# Patient Record
Sex: Female | Born: 1995 | Race: White | Hispanic: No | Marital: Single | Smoking: Never smoker
Health system: Southern US, Community
[De-identification: ages and names within clinical notes are randomized; demographics above are authoritative.]

## PROBLEM LIST (undated history)

## (undated) DIAGNOSIS — R51 Headache: Secondary | ICD-10-CM

## (undated) DIAGNOSIS — R109 Unspecified abdominal pain: Secondary | ICD-10-CM

## (undated) HISTORY — DX: Unspecified abdominal pain: R10.9

## (undated) HISTORY — DX: Headache: R51

---
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
## 2017-02-06 ENCOUNTER — Telehealth: Payer: Self-pay | Admitting: Internal Medicine

## 2017-02-06 NOTE — Telephone Encounter (Signed)
.    Can you clarify?  Is she on day 9 of a new pack and still bleeding?  Message unclear. The birth control is is not a new medication for her ,.but  It may have been affected by the zoloft we started Journie on at her last visit.  Breakthrough bleeding does sometimes occur when a new medication is started

## 2017-02-06 NOTE — Telephone Encounter (Signed)
Please advise 

## 2017-02-06 NOTE — Telephone Encounter (Signed)
Mom called regarding pt being on the birth control she's not supposed to have her period for one month and now she's on day nine. Please advise? Levonorgestrel-Ethinyl Estradiol (AMETHIA,CAMRESE) 0.15-0.03 &0.01 MG tablet  Call mom @ [phone]. Thank you!

## 2017-02-07 NOTE — Telephone Encounter (Signed)
Do you think pt should schedule an appt or wait a little bit longer to see if it stops? Please advise.

## 2017-02-07 NOTE — Telephone Encounter (Signed)
I WOULD WAIT A ANOTHER WEEK OR TWO

## 2017-02-07 NOTE — Telephone Encounter (Signed)
Patient is currently on day 10 of her period and a month away to start a period. Patients mother received Dr. Melina Schools statement and understands. She questions if pt should schedule an appt or wait it out considering the new medication causes break through bleeding.  Please call mother Misty Stanley [phone]

## 2017-02-07 NOTE — Telephone Encounter (Signed)
LMTCB

## 2017-02-11 NOTE — Telephone Encounter (Signed)
Yes

## 2017-02-11 NOTE — Telephone Encounter (Signed)
Patient's mother requested a call to discuss pt having a period for 14 days . Please call mother Misty Stanley) at [phone]

## 2017-02-11 NOTE — Telephone Encounter (Signed)
Patient scheduled.

## 2017-02-11 NOTE — Telephone Encounter (Signed)
Can you please schedule this appt for pt per Dr. Darrick Huntsman. Pt's mother is aware of appt date and time. Tomorrow at 4:30pm. Thank you so much!!

## 2017-02-11 NOTE — Telephone Encounter (Signed)
Spoke with pt's mother and she stated that she is very concerned about her daughter. She stated that the pt had her period for 13 days and stopped for 2 and as now started back as of yesterday. Pt mother stated that she is afraid that the pt has PCOS because she is showing some of the symptoms like hair loss and pain. She is also concerned because the pt already is anemic. Mother would like for pt to be seen as soon as possible. Would it be ok to schedule pt for tomorrow at 4:30pm?

## 2017-02-12 ENCOUNTER — Ambulatory Visit (INDEPENDENT_AMBULATORY_CARE_PROVIDER_SITE_OTHER): Payer: BLUE CROSS/BLUE SHIELD | Admitting: Internal Medicine

## 2017-02-12 ENCOUNTER — Other Ambulatory Visit: Payer: Self-pay | Admitting: Internal Medicine

## 2017-02-12 ENCOUNTER — Encounter: Payer: Self-pay | Admitting: Internal Medicine

## 2017-02-12 DIAGNOSIS — N939 Abnormal uterine and vaginal bleeding, unspecified: Secondary | ICD-10-CM

## 2017-02-12 DIAGNOSIS — N920 Excessive and frequent menstruation with regular cycle: Secondary | ICD-10-CM | POA: Diagnosis not present

## 2017-02-12 LAB — POCT URINE PREGNANCY: Preg Test, Ur: NEGATIVE

## 2017-02-12 MED ORDER — NORETHIN ACE-ETH ESTRAD-FE 1-20 MG-MCG PO TABS: 1.0000 | ORAL_TABLET | Freq: Every day | ORAL | 11 refills | Status: DC

## 2017-02-12 NOTE — Progress Notes (Signed)
Pre visit review using our clinic review tool, if applicable. No additional management support is needed unless otherwise documented below in the visit note. 

## 2017-02-12 NOTE — Progress Notes (Signed)
Subjective:  Patient ID: Heidi Goodwin, female    DOB: 13-Nov-1995  Age: 21 y.o. MRN: [id number]  CC: Diagnoses of Menorrhagia with regular cycle and Abnormal uterine bleeding unrelated to menstrual cycle were pertinent to this visit.  HPI CARLIS BURNSWORTH presents for eval and management of intermenstrual bleeding on extended oral hormonal contraception  History:  Started taking Camrese in October , and for 4 -5 months she had only scheduled bleeding which occurred only at the end  Of the 90 day cycle.   Then she had an unscheduled bleed on March 25th during her family vacation to [place].    Patient states that she has been bleeding for 13 days ,  Stopped for one day and started bleeding again 3 days ago.  The bleeding has not been heavy , but not light enough to go without a pad.  Sometimes would taper off by the end od the day  But always bleeds overnight .  She denies missing any doses,  Is not sexually active, and has not started any other medications or taken antibiotics. She denies bleeding from other mucous membranes and denies excessive bruising  Mother is concerned about PCOS because patient noted excessive hair on her abdomen and requested hair removal     Outpatient Medications Prior to Visit  Medication Sig Dispense Refill  . ALPRAZolam (XANAX) 0.25 MG tablet Take 1 tablet (0.25 mg total) by mouth daily as needed for anxiety. 30 tablet 0  . cyanocobalamin (,VITAMIN B-12,) 1000 MCG/ML injection Inject 1 mL (1,000 mcg total) into the muscle once a week. Weekly for 3 weeks,  Then monthly 10 mL 0  . INS SYRINGE/NEEDLE 1CC/28G (B-D INSULIN SYRINGE 1CC/28G) 28G X 1/2" 1 ML MISC For use with B12 injections 10 each 1  . sertraline (ZOLOFT) 50 MG tablet Take 1 tablet (50 mg total) by mouth daily. 90 tablet 0  . TANDEM 162-115.2 MG CAPS capsule TAKE 1 CAPSULE BY MOUTH DAILY WITH BREAKFAST. 30 capsule 3  . Levonorgestrel-Ethinyl Estradiol (AMETHIA,CAMRESE) 0.15-0.03  &0.01 MG tablet Take 1 tablet by mouth daily. 1 Package 4   No facility-administered medications prior to visit.     Review of Systems;  Patient denies headache, fevers, malaise, unintentional weight loss, skin rash, eye pain, sinus congestion and sinus pain, sore throat, dysphagia,  hemoptysis , cough, dyspnea, wheezing, chest pain, palpitations, orthopnea, edema, abdominal pain, nausea, melena, diarrhea, constipation, flank pain, dysuria, hematuria, urinary  Frequency, nocturia, numbness, tingling, seizures,  Focal weakness, Loss of consciousness,  Tremor, insomnia, depression, anxiety, and suicidal ideation.      Objective:  BP 112/74   Pulse (!) 118   Temp 97.8 F (36.6 C) (Oral)   Resp 17   Ht  (1.651 m)   Wt 121 lb 9.6 oz (55.2 kg)   SpO2 99%   BMI 20.24 kg/m   BP Readings from Last 3 Encounters:  02/12/17 112/74  11/23/16 128/70  08/15/16 112/68    Wt Readings from Last 3 Encounters:  02/12/17 121 lb 9.6 oz (55.2 kg) (43 %, Z= -0.17)*  11/23/16 124 lb 6 oz (56.4 kg) (50 %, Z= 0.00)*  08/15/16 123 lb 4 oz (55.9 kg) (49 %, Z= -0.03)*   * Growth percentiles are based on CDC 2-20 Years data.    General appearance: alert, cooperative and appears stated age Ears: normal TM's and external ear canals both ears Throat: lips, mucosa, and tongue normal; teeth and gums normal Neck: no adenopathy, no  carotid bruit, supple, symmetrical, trachea midline and thyroid not enlarged, symmetric, no tenderness/mass/nodules Back: symmetric, no curvature. ROM normal. No CVA tenderness. Lungs: clear to auscultation bilaterally Heart: regular rate and rhythm, S1, S2 normal, no murmur, click, rub or gallop Abdomen: soft, non-tender; bowel sounds normal; no masses,  no organomegaly Pulses: 2+ and symmetric Skin: Skin color, texture, turgor normal. No rashes or lesions Lymph nodes: Cervical, supraclavicular, and axillary nodes normal.  No results found for: HGBA1C  Lab Results    Component Value Date   CREATININE 0.85 02/14/2017   CREATININE 0.78 11/23/2016   CREATININE 0.73 08/15/2016    Lab Results  Component Value Date   WBC 5.7 02/14/2017   HGB 15.3 02/14/2017   HCT 45.5 02/14/2017   PLT 252.0 02/14/2017   GLUCOSE 77 02/14/2017   ALT 9 02/14/2017   AST 11 02/14/2017   NA 140 02/14/2017   K 4.3 02/14/2017   CL 104 02/14/2017   CREATININE 0.85 02/14/2017   BUN 11 02/14/2017   CO2 28 02/14/2017   TSH 2.04 02/14/2017   INR 1.0 02/14/2017     Assessment & Plan:   Problem List Items Addressed This Visit    Abnormal uterine bleeding unrelated to menstrual cycle    Intermenstrual bleeding occurring ,  Has been on extended OCs (camrese).  Will workup for PCOS and change from levonorgestrel to less androgenic progestin with norethindrone /30 mcg EE  (Junel FE) .       Menorrhagia with regular cycle    Symptoms improved initially with use of oral contraceptive hormones but has now become unscheduled intermenstrual.          A total of 25 minutes of face to face time was spent with patient more than half of which was spent in counselling about the above mentioned conditions  and coordination of care  I have discontinued Ms. Saber's Levonorgestrel-Ethinyl Estradiol. I am also having her start on norethindrone-ethinyl estradiol. Additionally, I am having her maintain her cyanocobalamin, INS SYRINGE/NEEDLE 1CC/28G, sertraline, ALPRAZolam, and TANDEM.  Meds ordered this encounter  Medications  . norethindrone-ethinyl estradiol (JUNEL FE 1/20) 1-20 MG-MCG tablet    Sig: Take 1 tablet by mouth daily.    Dispense:  1 Package    Refill:  11    Medications Discontinued During This Encounter  Medication Reason  . Levonorgestrel-Ethinyl Estradiol (AMETHIA,CAMRESE) 0.15-0.03 &0.01 MG tablet     Follow-up: No Follow-up on file.   Sherlene Shams, MD

## 2017-02-14 ENCOUNTER — Other Ambulatory Visit (INDEPENDENT_AMBULATORY_CARE_PROVIDER_SITE_OTHER): Payer: BLUE CROSS/BLUE SHIELD

## 2017-02-14 DIAGNOSIS — N939 Abnormal uterine and vaginal bleeding, unspecified: Secondary | ICD-10-CM

## 2017-02-14 LAB — CBC WITH DIFFERENTIAL/PLATELET
Basophils Absolute: 0 10*3/uL (ref 0.0–0.1)
Basophils Relative: 0.4 % (ref 0.0–3.0)
EOS ABS: 0.1 10*3/uL (ref 0.0–0.7)
Eosinophils Relative: 1.6 % (ref 0.0–5.0)
HCT: 45.5 % (ref 36.0–49.0)
HEMOGLOBIN: 15.3 g/dL (ref 12.0–16.0)
LYMPHS ABS: 1.5 10*3/uL (ref 0.7–4.0)
Lymphocytes Relative: 26.9 % (ref 24.0–48.0)
MCHC: 33.7 g/dL (ref 31.0–37.0)
MCV: 94.2 fl (ref 78.0–98.0)
MONO ABS: 0.4 10*3/uL (ref 0.1–1.0)
Monocytes Relative: 7 % (ref 3.0–12.0)
NEUTROS PCT: 64.1 % (ref 43.0–71.0)
Neutro Abs: 3.7 10*3/uL (ref 1.4–7.7)
Platelets: 252 10*3/uL (ref 150.0–575.0)
RBC: 4.83 Mil/uL (ref 3.80–5.70)
RDW: 12.5 % (ref 11.4–15.5)
WBC: 5.7 10*3/uL (ref 4.5–13.5)

## 2017-02-14 LAB — COMPREHENSIVE METABOLIC PANEL
ALBUMIN: 4.6 g/dL (ref 3.5–5.2)
ALT: 9 U/L (ref 0–35)
AST: 11 U/L (ref 0–37)
Alkaline Phosphatase: 41 U/L — ABNORMAL LOW (ref 47–119)
BUN: 11 mg/dL (ref 6–23)
CHLORIDE: 104 meq/L (ref 96–112)
CO2: 28 meq/L (ref 19–32)
CREATININE: 0.85 mg/dL (ref 0.40–1.20)
Calcium: 9.7 mg/dL (ref 8.4–10.5)
GFR: 92.12 mL/min (ref >60.00)
Glucose, Bld: 77 mg/dL (ref 70–99)
Potassium: 4.3 mEq/L (ref 3.5–5.1)
SODIUM: 140 meq/L (ref 135–145)
Total Bilirubin: 0.5 mg/dL (ref 0.3–1.2)
Total Protein: 7.3 g/dL (ref 6.0–8.3)

## 2017-02-14 LAB — FERRITIN: FERRITIN: 14.2 ng/mL (ref 10.0–291.0)

## 2017-02-14 LAB — TSH: TSH: 2.04 u[IU]/mL (ref 0.40–5.00)

## 2017-02-14 LAB — IBC PANEL
IRON: 157 ug/dL — AB (ref 42–145)
SATURATION RATIOS: 30.6 % (ref 20.0–50.0)
Transferrin: 367 mg/dL — ABNORMAL HIGH (ref 212.0–360.0)

## 2017-02-14 LAB — PROTIME-INR
INR: 1 ratio (ref 0.8–1.0)
Prothrombin Time: 10.8 s (ref 9.6–13.1)

## 2017-02-14 NOTE — Assessment & Plan Note (Signed)
Symptoms improved initially with use of oral contraceptive hormones but has now become unscheduled intermenstrual.

## 2017-02-14 NOTE — Assessment & Plan Note (Addendum)
Intermenstrual bleeding occurring ,  Has been on extended OCs (camrese).  Will workup for PCOS and change from levonorgestrel to less androgenic progestin with norethindrone /30 mcg EE  (Junel FE) . Urein pregnancy test is negative

## 2017-02-15 LAB — PROLACTIN: PROLACTIN: 26.4 ng/mL

## 2017-02-17 LAB — TESTOS,TOTAL,FREE AND SHBG (FEMALE)
Sex Hormone Binding Glob.: 113 nmol/L (ref 17–124)
TESTOSTERONE,TOTAL,LC/MS/MS: 15 ng/dL (ref 2–45)
Testosterone, Free: 0.8 pg/mL (ref 0.1–6.4)

## 2017-02-18 ENCOUNTER — Encounter: Payer: Self-pay | Admitting: Internal Medicine

## 2017-02-18 ENCOUNTER — Telehealth: Payer: Self-pay | Admitting: Internal Medicine

## 2017-02-18 ENCOUNTER — Telehealth: Payer: Self-pay

## 2017-02-18 DIAGNOSIS — N939 Abnormal uterine and vaginal bleeding, unspecified: Secondary | ICD-10-CM

## 2017-02-18 NOTE — Telephone Encounter (Signed)
ordered

## 2017-02-18 NOTE — Assessment & Plan Note (Signed)
All labs normal including testosterone.  OCP changed to include less androgenic progesterone (norethindrone). If bleeding continues will prescribe 10 days of norethindrone 5 mg daily .  US pelvis pending

## 2017-02-20 ENCOUNTER — Encounter: Payer: Self-pay | Admitting: Internal Medicine

## 2017-02-20 ENCOUNTER — Ambulatory Visit
Admission: RE | Admit: 2017-02-20 | Discharge: 2017-02-20 | Disposition: A | Discharge status: Home / Self Care | Payer: BLUE CROSS/BLUE SHIELD | Source: Ambulatory Visit | Attending: Internal Medicine | Admitting: Internal Medicine

## 2017-02-20 DIAGNOSIS — N939 Abnormal uterine and vaginal bleeding, unspecified: Secondary | ICD-10-CM | POA: Insufficient documentation

## 2017-02-21 ENCOUNTER — Telehealth: Payer: Self-pay | Admitting: *Deleted

## 2017-02-21 ENCOUNTER — Other Ambulatory Visit: Payer: Self-pay | Admitting: Internal Medicine

## 2017-02-21 NOTE — Telephone Encounter (Signed)
mychart message was sent to patient already, mom is on DPR, attempted to reach her and left a VM to return my call, thanks

## 2017-02-21 NOTE — Telephone Encounter (Signed)
Patient's mother has requested to know if the ultrasound results are in  [place] [phone]

## 2017-02-22 ENCOUNTER — Other Ambulatory Visit: Payer: Self-pay | Admitting: Internal Medicine

## 2017-02-22 MED ORDER — SERTRALINE HCL 50 MG PO TABS: 50.0000 mg | ORAL_TABLET | Freq: Every day | ORAL | 1 refills | Status: DC

## 2017-02-22 NOTE — Telephone Encounter (Signed)
Refill sent.

## 2017-02-22 NOTE — Telephone Encounter (Signed)
Pharmacy called and is requesting a refill on pt's sertraline (ZOLOFT) 50 MG tablet. Pt does not have enough to get her through the weekend. Please advise, thank you!  Pharmacy - [facility] - [place], [place] - [address]

## 2017-02-25 NOTE — Telephone Encounter (Signed)
Patient's mother requested a call [phone]  Patient is having trouble signing into her mychart

## 2017-02-25 NOTE — Telephone Encounter (Signed)
LMTCB

## 2017-02-26 NOTE — Telephone Encounter (Signed)
It should improve with new hormones prescribed.  Since she is in her period week I do not want to add anything .  If after the first week of her new pack it has not stopped I will prescribe additional progesterone so let me know.

## 2017-02-26 NOTE — Telephone Encounter (Signed)
LVM for mother to call back

## 2017-02-26 NOTE — Telephone Encounter (Signed)
Spoke with pt's mom and informed her of the pt's [place] results because they have not been able to access her mychart account. The pt's mother stated that the pt is still bleeding pretty heavy, having some bad cramping but has not yet started the new hormone packet yet. Mother stated that the pt is now on the period week of her old pill pack. Please advise.

## 2017-02-28 NOTE — Telephone Encounter (Signed)
Spoke with pt's mother to inform her of the message below. The mother stated that yesterday the pt had to come home from school because her pad became so saturated that she bled through, she was passing large clots, and cramping really bad. The mother stated that she contacted a GYN and has gotten the pt an appt see one. Mother stated that she would let you know what they say as soon as she is seen.

## 2017-03-04 NOTE — Telephone Encounter (Signed)
Mailed unread message to patient.  

## 2017-05-17 ENCOUNTER — Telehealth: Payer: Self-pay | Admitting: Internal Medicine

## 2017-05-17 MED ORDER — FERROUS FUM-IRON POLYSACCH 162-115.2 MG PO CAPS: 1.0000 | ORAL_CAPSULE | Freq: Every day | ORAL | 1 refills | Status: DC

## 2017-05-17 NOTE — Telephone Encounter (Signed)
Refill sent as requested. 

## 2017-05-17 NOTE — Telephone Encounter (Signed)
Pt now needs the refills on Blisovi FE tab  sent to [facility] on [address] [place] and it needs to be a 90 day supply in order for insurance to cover.

## 2017-06-10 ENCOUNTER — Ambulatory Visit: Payer: BLUE CROSS/BLUE SHIELD | Admitting: Internal Medicine

## 2017-07-31 NOTE — Telephone Encounter (Signed)
Error

## 2017-08-14 ENCOUNTER — Other Ambulatory Visit: Payer: Self-pay

## 2017-08-14 MED ORDER — SERTRALINE HCL 50 MG PO TABS: 50.0000 mg | ORAL_TABLET | Freq: Every day | ORAL | 1 refills | Status: DC

## 2017-08-15 ENCOUNTER — Ambulatory Visit (INDEPENDENT_AMBULATORY_CARE_PROVIDER_SITE_OTHER): Payer: BLUE CROSS/BLUE SHIELD

## 2017-08-15 DIAGNOSIS — Z23 Encounter for immunization: Secondary | ICD-10-CM

## 2018-01-28 ENCOUNTER — Telehealth: Payer: Self-pay | Admitting: Internal Medicine

## 2018-01-28 MED ORDER — NORETHIN ACE-ETH ESTRAD-FE 1-20 MG-MCG PO TABS: 1.0000 | ORAL_TABLET | Freq: Every day | ORAL | 1 refills | Status: DC

## 2018-01-28 NOTE — Telephone Encounter (Signed)
Copied from CRM [phone]#[id number]. Topic: Quick Communication - Rx Refill/Question >> Jan 28, 2018 12:20 PM Zuzu Befort, Tresa EndoKelly, [place] wrote: Medication: Junel FE 1-20 mg-mcg Has the patient contacted their pharmacy? Yes Patients mother calling on behalf of her daughter because she needs a refill on the above medication. Stated that they have been in contact with the pharmacy as well Preferred Pharm[facility] (with phone number or street name[facility] [place] [address] [phone] Agent: Please be advised that RX refills may take up to 3 business days. We ask that you follow-up with your pharmacy.

## 2018-02-24 ENCOUNTER — Telehealth: Payer: Self-pay | Admitting: Internal Medicine

## 2018-02-24 NOTE — Telephone Encounter (Signed)
Copied from CRM [phone]. Topic: Quick Communication - Rx Refill/Question >> Feb 24, 2018  3:40 PM Percival SpanishKennedy, Cheryl W wrote: Medication  sertraline (ZOLOFT) 50 MG tablet  Has the patient contacted their pharmacy yes  has no more refills   [facility] [facility] [address]      Agent: Please be advised that RX refills may take up to 3 business days. We ask that you follow-up with your pharmacy.

## 2018-02-25 ENCOUNTER — Other Ambulatory Visit: Payer: Self-pay | Admitting: Internal Medicine

## 2018-02-25 ENCOUNTER — Other Ambulatory Visit: Payer: Self-pay

## 2018-02-25 MED ORDER — SERTRALINE HCL 50 MG PO TABS: 50.0000 mg | ORAL_TABLET | Freq: Every day | ORAL | 0 refills | Status: DC

## 2018-03-20 ENCOUNTER — Ambulatory Visit: Payer: BLUE CROSS/BLUE SHIELD | Admitting: Internal Medicine

## 2018-03-20 ENCOUNTER — Encounter: Payer: Self-pay | Admitting: Internal Medicine

## 2018-03-20 VITALS — BP 102/64 | HR 68 | Temp 97.6°F | Resp 15 | Ht 65.04 in | Wt 113.4 lb

## 2018-03-20 DIAGNOSIS — E538 Deficiency of other specified B group vitamins: Secondary | ICD-10-CM | POA: Diagnosis not present

## 2018-03-20 DIAGNOSIS — N920 Excessive and frequent menstruation with regular cycle: Secondary | ICD-10-CM

## 2018-03-20 DIAGNOSIS — R634 Abnormal weight loss: Secondary | ICD-10-CM | POA: Diagnosis not present

## 2018-03-20 DIAGNOSIS — Z0000 Encounter for general adult medical examination without abnormal findings: Secondary | ICD-10-CM

## 2018-03-20 DIAGNOSIS — Z79899 Other long term (current) drug therapy: Secondary | ICD-10-CM | POA: Diagnosis not present

## 2018-03-20 DIAGNOSIS — Z13220 Encounter for screening for lipoid disorders: Secondary | ICD-10-CM | POA: Diagnosis not present

## 2018-03-20 DIAGNOSIS — D509 Iron deficiency anemia, unspecified: Secondary | ICD-10-CM | POA: Diagnosis not present

## 2018-03-20 LAB — COMPREHENSIVE METABOLIC PANEL
ALT: 8 U/L (ref 0–35)
AST: 11 U/L (ref 0–37)
Albumin: 4.4 g/dL (ref 3.5–5.2)
Alkaline Phosphatase: 44 U/L — ABNORMAL LOW (ref 47–119)
BUN: 10 mg/dL (ref 6–23)
CO2: 26 meq/L (ref 19–32)
Calcium: 9.7 mg/dL (ref 8.4–10.5)
Chloride: 103 mEq/L (ref 96–112)
Creatinine, Ser: 0.79 mg/dL (ref 0.40–1.20)
GFR: 99.07 mL/min (ref >60.00)
GLUCOSE: 82 mg/dL (ref 70–99)
POTASSIUM: 4.3 meq/L (ref 3.5–5.1)
SODIUM: 139 meq/L (ref 135–145)
Total Bilirubin: 0.5 mg/dL (ref 0.2–1.2)
Total Protein: 7.5 g/dL (ref 6.0–8.3)

## 2018-03-20 LAB — CBC WITH DIFFERENTIAL/PLATELET
BASOS ABS: 0 10*3/uL (ref 0.0–0.1)
Basophils Relative: 0.5 % (ref 0.0–3.0)
EOS PCT: 1.1 % (ref 0.0–5.0)
Eosinophils Absolute: 0.1 10*3/uL (ref 0.0–0.7)
HEMATOCRIT: 42.8 % (ref 36.0–49.0)
Hemoglobin: 14.5 g/dL (ref 12.0–16.0)
LYMPHS PCT: 29.5 % (ref 24.0–48.0)
Lymphs Abs: 1.6 10*3/uL (ref 0.7–4.0)
MCHC: 33.8 g/dL (ref 31.0–37.0)
MCV: 92.4 fl (ref 78.0–98.0)
MONOS PCT: 7.8 % (ref 3.0–12.0)
Monocytes Absolute: 0.4 10*3/uL (ref 0.1–1.0)
NEUTROS ABS: 3.3 10*3/uL (ref 1.4–7.7)
Neutrophils Relative %: 61.1 % (ref 43.0–71.0)
PLATELETS: 259 10*3/uL (ref 150.0–575.0)
RBC: 4.63 Mil/uL (ref 3.80–5.70)
RDW: 13.1 % (ref 11.4–15.5)
WBC: 5.3 10*3/uL (ref 4.5–13.5)

## 2018-03-20 LAB — LIPID PANEL
Cholesterol: 205 mg/dL — ABNORMAL HIGH (ref 0–200)
HDL: 59.7 mg/dL (ref >39.00)
LDL CALC: 119 mg/dL — AB (ref 0–99)
NonHDL: 144.88
Total CHOL/HDL Ratio: 3
Triglycerides: 127 mg/dL (ref 0.0–149.0)
VLDL: 25.4 mg/dL (ref 0.0–40.0)

## 2018-03-20 LAB — VITAMIN B12: Vitamin B-12: 222 pg/mL (ref 211–911)

## 2018-03-20 LAB — TSH: TSH: 1.57 u[IU]/mL (ref 0.40–5.00)

## 2018-03-20 NOTE — Progress Notes (Signed)
Patient ID: Heidi Goodwin, female    DOB: 03/29/1996  Age: 21 y.o. MRN: [id number]  The patient is here for annual preventive examination and management of other chronic and acute problems.   The risk factors are reflected in the social history.  The roster of all physicians providing medical care to patient - is listed in the Snapshot section of the chart.  Activities of daily living:  The patient is 100% independent in all ADLs: dressing, toileting, feeding as well as independent mobility  Home safety : The patient has smoke detectors in the home. They wear seatbelts.  There are no firearms at home. There is no violence in the home.   There is no risks for hepatitis, STDs or HIV. There is no   history of blood transfusion. They have no travel history to infectious disease endemic areas of the world.  The patient has seen their dentist in the last six month. They have seen their eye doctor in the last year.    Discussed the need for sun protection: hats, long sleeves and use of sunscreen if there is significant sun exposure.   Diet: the importance of a healthy diet is discussed. They do have a healthy diet.  The benefits of regular aerobic exercise were discussed. She walks 4 times per week ,  20 minutes.   Depression screen: there are no signs or vegative symptoms of depression- irritability, change in appetite, anhedonia, sadness/tearfullness.  The following portions of the patient's history were reviewed and updated as appropriate: allergies, current medications, past family history, past medical history,  past surgical history, past social history  and problem list.  Visual acuity was not assessed per patient preference since she has regular follow up with her ophthalmologist. Hearing and body mass index were assessed and reviewed.   During the course of the visit the patient was educated and counseled about appropriate screening and preventive services including : fall  prevention , diabetes screening, nutrition counseling, colorectal cancer screening, and recommended immunizations.    CC: The primary encounter diagnosis was Long-term use of high-risk medication. Diagnoses of Screening for hyperlipidemia, Iron deficiency anemia, unspecified iron deficiency anemia type, B12 deficiency, Weight loss, Encounter for preventive health examination, and Menorrhagia with regular cycle were also pertinent to this visit. Finished freshman year at [employer].  Happy there,  Moving into apartment next year  Lost weight be "the dfood was not good." doing well in classes    History Heidi Goodwin has a past medical history of Abdominal discomfort (2016) and Frequent headaches (2015).   She has a past surgical history that includes Tonsilectomy/adenoidectomy with myringotomy (Bilateral, 2009).   Her family history includes Cancer (age of onset: 46) in her paternal grandfather.She reports that she has never smoked. She has never used smokeless tobacco. She reports that she does not drink alcohol or use drugs.  Outpatient Medications Prior to Visit  Medication Sig Dispense Refill  . ALPRAZolam (XANAX) 0.25 MG tablet Take 1 tablet (0.25 mg total) by mouth daily as needed for anxiety. 30 tablet 0  . cyanocobalamin (,VITAMIN B-12,) 1000 MCG/ML injection Inject 1 mL (1,000 mcg total) into the muscle once a week. Weekly for 3 weeks,  Then monthly 10 mL 0  . ferrous fumarate-iron polysaccharide complex (TANDEM) 162-115.2 MG CAPS capsule Take 1 capsule by mouth daily with breakfast. 90 capsule 1  . INS SYRINGE/NEEDLE 1CC/28G (B-D INSULIN SYRINGE 1CC/28G) 28G X 1/2" 1 ML MISC For use with B12 injections 10 each  1  . norethindrone-ethinyl estradiol (JUNEL FE 1/20) 1-20 MG-MCG tablet Take 1 tablet by mouth daily. MUST KEEP APPT ON 03/20/18 FOR REFILLS 28 tablet 0  . sertraline (ZOLOFT) 50 MG tablet Take 1 tablet (50 mg total) by mouth daily. 30 tablet 0  . ferrous fumarate-iron polysaccharide  complex (TANDEM) 162-115.2 MG CAPS capsule Take 1 capsule by mouth daily with breakfast. (Patient not taking: Reported on 03/20/2018) 90 capsule 1   No facility-administered medications prior to visit.     Review of Systems   Patient denies headache, fevers, malaise, unintentional weight loss, skin rash, eye pain, sinus congestion and sinus pain, sore throat, dysphagia,  hemoptysis , cough, dyspnea, wheezing, chest pain, palpitations, orthopnea, edema, abdominal pain, nausea, melena, diarrhea, constipation, flank pain, dysuria, hematuria, urinary  Frequency, nocturia, numbness, tingling, seizures,  Focal weakness, Loss of consciousness,  Tremor, insomnia, depression, anxiety, and suicidal ideation.     Objective:  BP 102/64 (BP Location: Left Arm, Patient Position: Sitting, Cuff Size: Normal)   Pulse 68   Temp 97.6 F (36.4 C) (Oral)   Resp 15   Ht 5' 5.04" (1.652 m)   Wt 113 lb 6.4 oz (51.4 kg)   SpO2 99%   BMI 18.85 kg/m   Physical Exam  General appearance: alert, cooperative and appears stated age Ears: normal TM's and external ear canals both ears Throat: lips, mucosa, and tongue normal; teeth and gums normal Neck: no adenopathy, no carotid bruit, supple, symmetrical, trachea midline and thyroid not enlarged, symmetric, no tenderness/mass/nodules Back: symmetric, no curvature. ROM normal. No CVA tenderness. Lungs: clear to auscultation bilaterally Heart: regular rate and rhythm, S1, S2 normal, no murmur, click, rub or gallop Abdomen: soft, non-tender; bowel sounds normal; no masses,  no organomegaly Pulses: 2+ and symmetric Skin: Skin color, texture, turgor normal. No rashes or lesions Lymph nodes: Cervical, supraclavicular, and axillary nodes normal.    Assessment & Plan:   Problem List Items Addressed This Visit    Iron deficiency anemia   Relevant Orders   Iron, TIBC and Ferritin Panel (Completed)   CBC with Differential/Platelet (Completed)   Menorrhagia with  regular cycle    improved with change in oral contraception       Encounter for preventive health examination    Annual  comprehensive physical examdone  .  During the course of the visit the patient was educated and counseled about appropriate screening and preventive services and screenings were discussed with regard to timing of initiation of cervical and breast cancer .  She is not sexually active and has had the Gardasil vaccine series as an adolescent.  She has a normal lipid profile and does not require annual lipid screening.   nutrition counseling, skin cancer screening has been done, along with review of the age appropriate recommended immunizations.  Printed recommendations for health maintenance screenings was given.       B12 deficiency   Relevant Orders   Vitamin B12 (Completed)    Other Visit Diagnoses    Long-term use of high-risk medication    -  Primary   Relevant Orders   Comprehensive metabolic panel (Completed)   Screening for hyperlipidemia       Relevant Orders   Lipid panel (Completed)   Weight loss       Relevant Orders   TSH (Completed)      I am having Heidi Goodwin "Heidi Goodwin" maintain her cyanocobalamin, INS SYRINGE/NEEDLE 1CC/28G, ALPRAZolam, ferrous fumarate-iron polysaccharide complex, sertraline, and norethindrone-ethinyl estradiol.  No orders of the defined types were placed in this encounter.   Medications Discontinued During This Encounter  Medication Reason  . ferrous fumarate-iron polysaccharide complex (TANDEM) 162-115.2 MG CAPS capsule Duplicate    Follow-up: No follow-ups on file.   Sherlene Shams, MD

## 2018-03-20 NOTE — Patient Instructions (Signed)

## 2018-03-21 LAB — IRON,TIBC AND FERRITIN PANEL
%SAT: 32 % (ref 8–45)
Ferritin: 15 ng/mL (ref 6–67)
Iron: 146 ug/dL (ref 27–164)
TIBC: 461 mcg/dL (calc) — ABNORMAL HIGH (ref 271–448)

## 2018-03-22 DIAGNOSIS — Z0000 Encounter for general adult medical examination without abnormal findings: Secondary | ICD-10-CM | POA: Insufficient documentation

## 2018-03-22 NOTE — Assessment & Plan Note (Addendum)
Annual  comprehensive physical examdone  .  During the course of the visit the patient was educated and counseled about appropriate screening and preventive services and screenings were discussed with regard to timing of initiation of cervical and breast cancer .  She is not sexually active and has had the Gardasil vaccine series as an adolescent.  She has a normal lipid profile and does not require annual lipid screening.   nutrition counseling, skin cancer screening has been done, along with review of the age appropriate recommended immunizations.  Printed recommendations for health maintenance screenings was given.

## 2018-03-22 NOTE — Assessment & Plan Note (Signed)
improved with change in oral contraception

## 2018-03-25 ENCOUNTER — Other Ambulatory Visit: Payer: Self-pay | Admitting: Internal Medicine

## 2018-03-26 NOTE — Telephone Encounter (Signed)
Pt had an appt 5/16 to refill this med and it is not at the pharmacy. Pt requesting this refill sertraline (ZOLOFT) 50 MG tablet  [id number] - [place], [place] - [address] AT [address] [phone] (Phone) [phone] (Fax)

## 2018-04-07 ENCOUNTER — Telehealth: Payer: Self-pay | Admitting: Internal Medicine

## 2018-04-07 NOTE — Telephone Encounter (Unsigned)
Copied from CRM [id number]. Topic: Quick Communication - Rx Refill/Question >> Apr 07, 2018  1:15 PM Heidi SarnaHayes, Heidi G wrote: norethindrone-ethinyl estradiol (JUNEL FE 1/20) 1-20 MG-MCG tablet-   Insurance will pay only if the Rx is a 90 day supply. Please send in Rx for 90 day supply.  [phone] - [place], [place] - [address] [facility] [address] [place] [id number] Phone: [phone] Fax: [phone]

## 2018-04-09 ENCOUNTER — Other Ambulatory Visit: Payer: Self-pay

## 2018-04-09 MED ORDER — NORETHIN ACE-ETH ESTRAD-FE 1-20 MG-MCG PO TABS: 1.0000 | ORAL_TABLET | Freq: Every day | ORAL | 0 refills | Status: DC

## 2018-04-09 NOTE — Telephone Encounter (Signed)
[facility] [address] [place]

## 2018-04-10 ENCOUNTER — Ambulatory Visit: Payer: BLUE CROSS/BLUE SHIELD

## 2018-04-17 ENCOUNTER — Ambulatory Visit (INDEPENDENT_AMBULATORY_CARE_PROVIDER_SITE_OTHER): Payer: BLUE CROSS/BLUE SHIELD | Admitting: *Deleted

## 2018-04-17 DIAGNOSIS — E538 Deficiency of other specified B group vitamins: Secondary | ICD-10-CM | POA: Diagnosis not present

## 2018-04-17 MED ORDER — CYANOCOBALAMIN 1000 MCG/ML IJ SOLN
1000.0000 ug | Freq: Once | INTRAMUSCULAR | Status: AC
  Administered 2018-04-17: 1000 ug via INTRAMUSCULAR

## 2018-04-17 NOTE — Progress Notes (Signed)
Patient presented for B 12 injection to left deltoid, patient voiced no concerns nor showed any signs of distress during injection See lab note for B 12 order.

## 2018-04-24 ENCOUNTER — Ambulatory Visit (INDEPENDENT_AMBULATORY_CARE_PROVIDER_SITE_OTHER): Payer: BLUE CROSS/BLUE SHIELD

## 2018-04-24 DIAGNOSIS — E538 Deficiency of other specified B group vitamins: Secondary | ICD-10-CM

## 2018-04-24 MED ORDER — CYANOCOBALAMIN 1000 MCG/ML IJ SOLN
1000.0000 ug | Freq: Once | INTRAMUSCULAR | Status: AC
  Administered 2018-04-24: 1000 ug via INTRAMUSCULAR

## 2018-04-24 NOTE — Progress Notes (Signed)
Patient comes in for a vitamin B 12 injection. Administered in right deltoid IM. Patient tolerated well.

## 2018-08-17 ENCOUNTER — Other Ambulatory Visit: Payer: Self-pay | Admitting: Internal Medicine

## 2018-09-22 ENCOUNTER — Other Ambulatory Visit: Payer: Self-pay | Admitting: Internal Medicine

## 2018-12-11 ENCOUNTER — Other Ambulatory Visit: Payer: Self-pay | Admitting: Internal Medicine

## 2019-01-14 IMAGING — US US PELVIS COMPLETE
1 series · 14 of 25 positions shown · non-contrast
Comparison: None.

CLINICAL DATA: Intermittent vaginal bleeding starting 01/27/2017,
virginal patient on OCP

EXAM:
TRANSABDOMINAL ULTRASOUND OF PELVIS
TECHNIQUE: Transabdominal ultrasound examination of the pelvis was performed
including evaluation of the uterus, ovaries, adnexal regions, and
pelvic cul-de-sac.

[Series 1: us pelvis complete · 0.24mm/px · 14 of 47 slices shown]
[im 1/47]
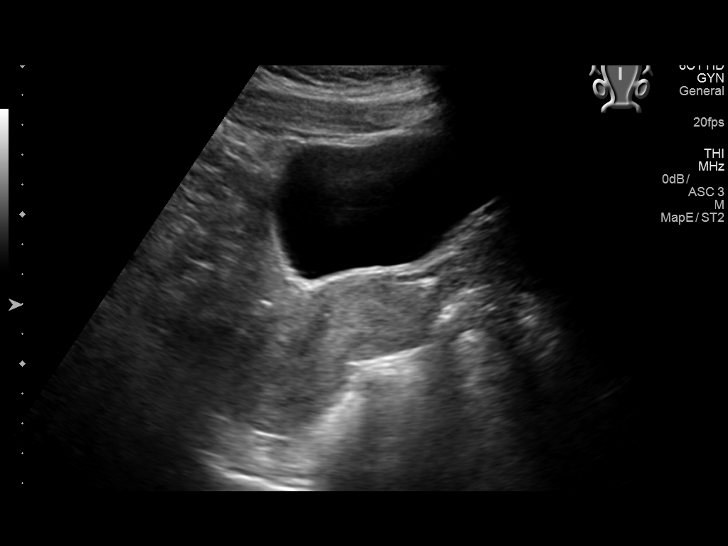
[im 4/47]
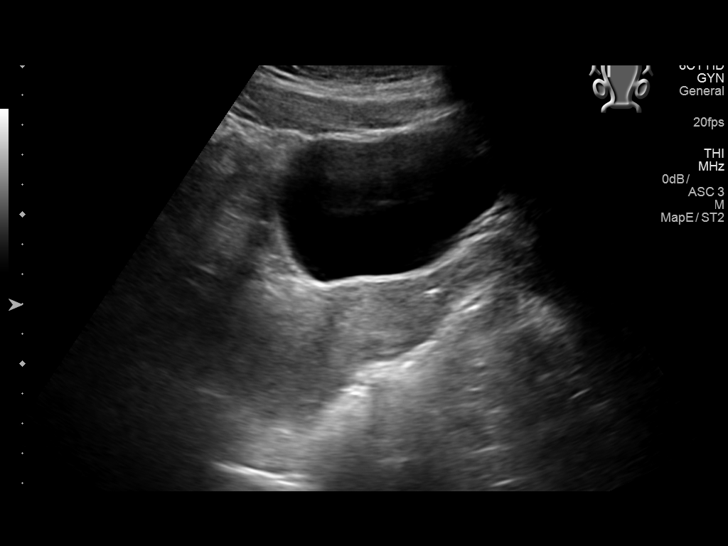
[im 8/47]
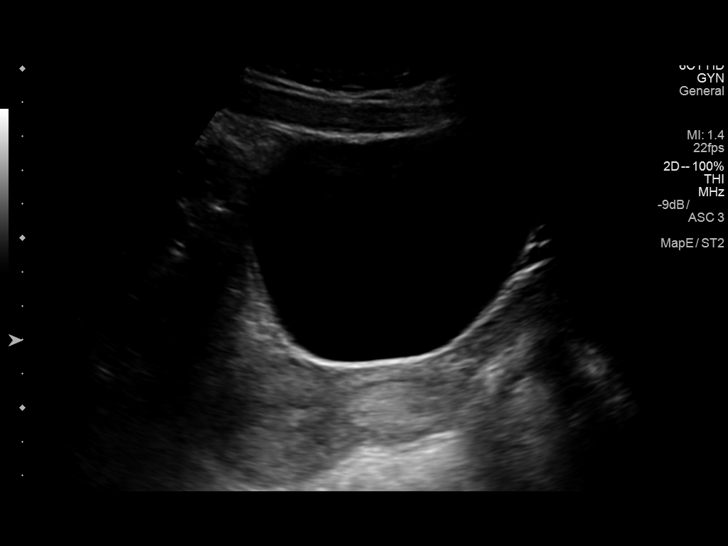
[im 12/47]
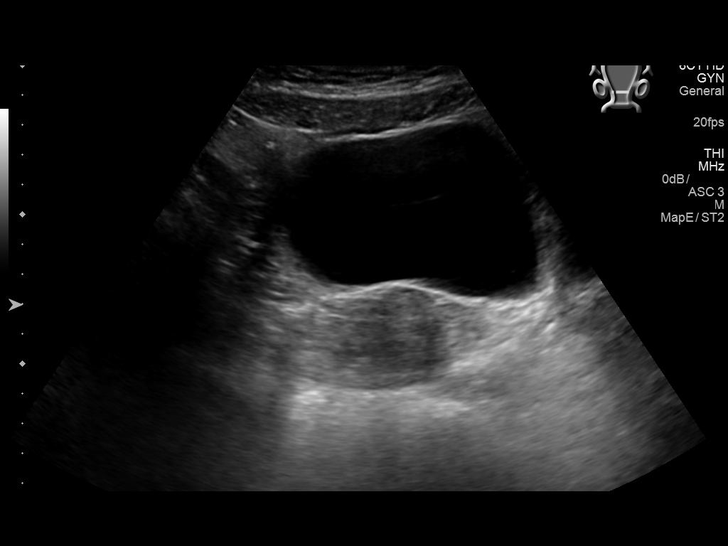
[im 16/47]
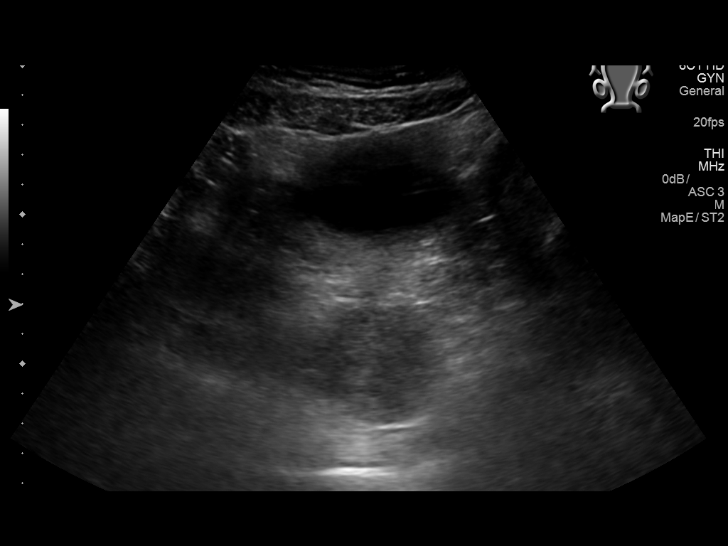
[im 18/47]
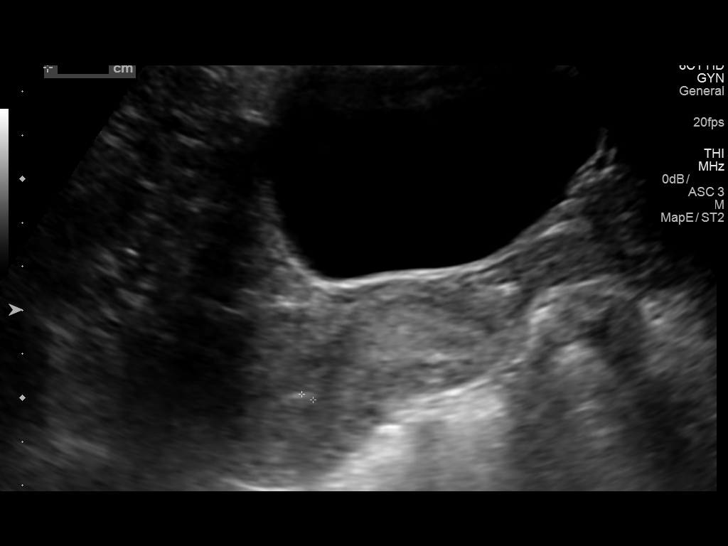
[im 22/47]
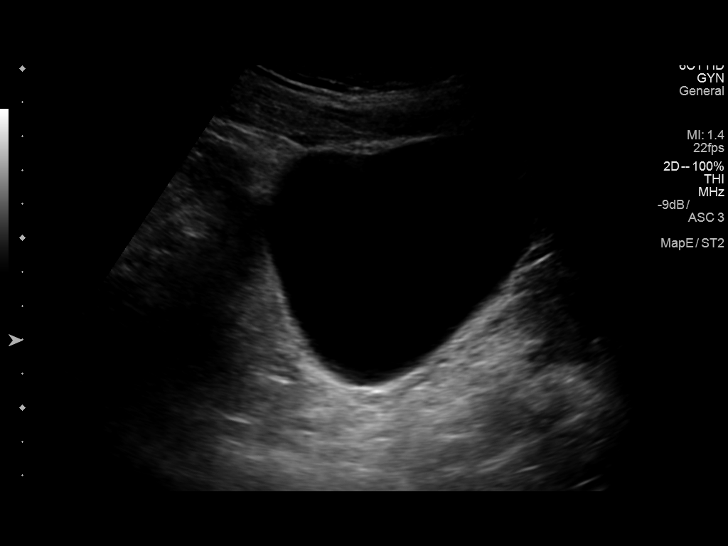
[im 25/47]
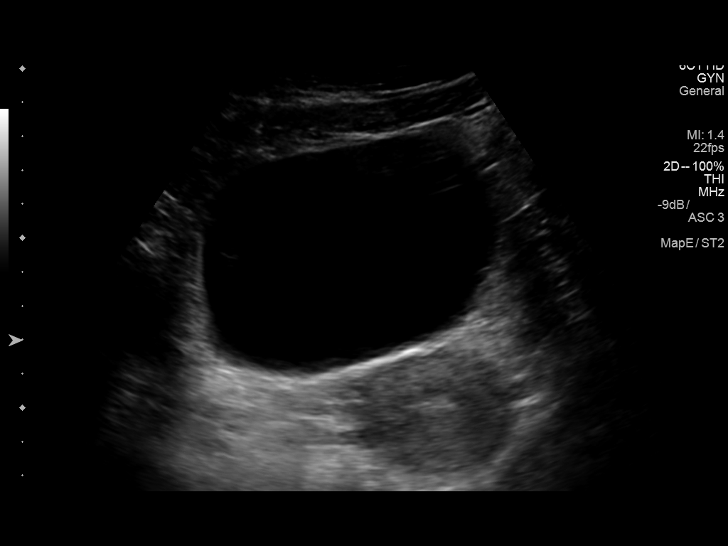
[im 29/47]
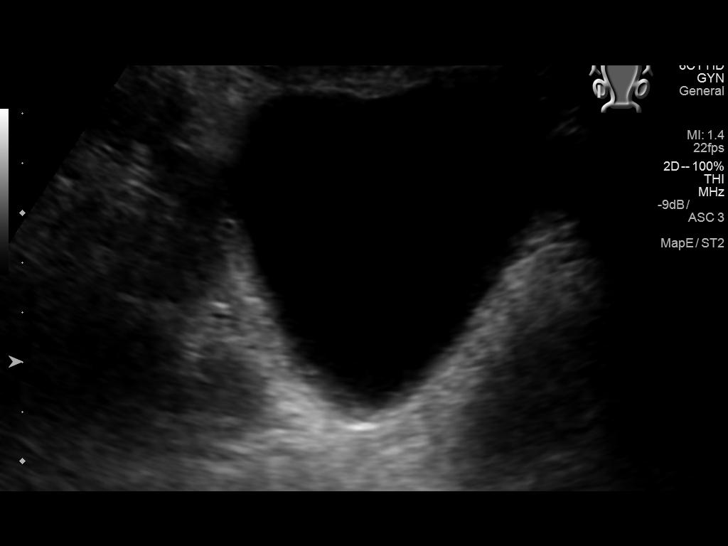
[im 31/47]
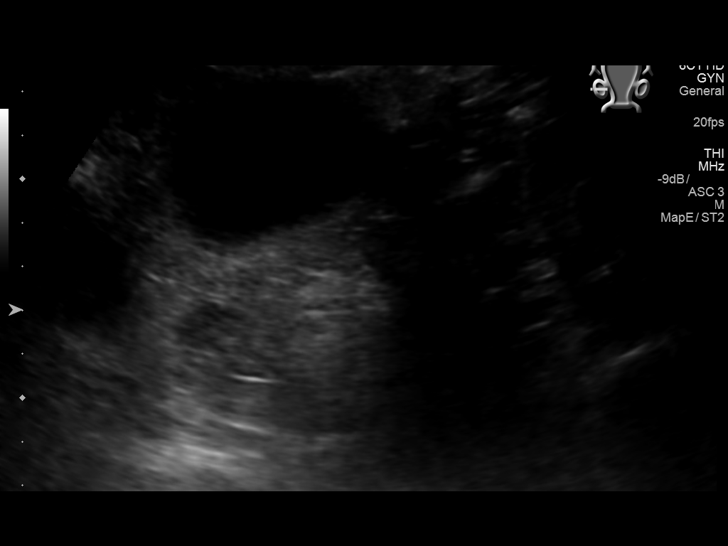
[im 35/47]
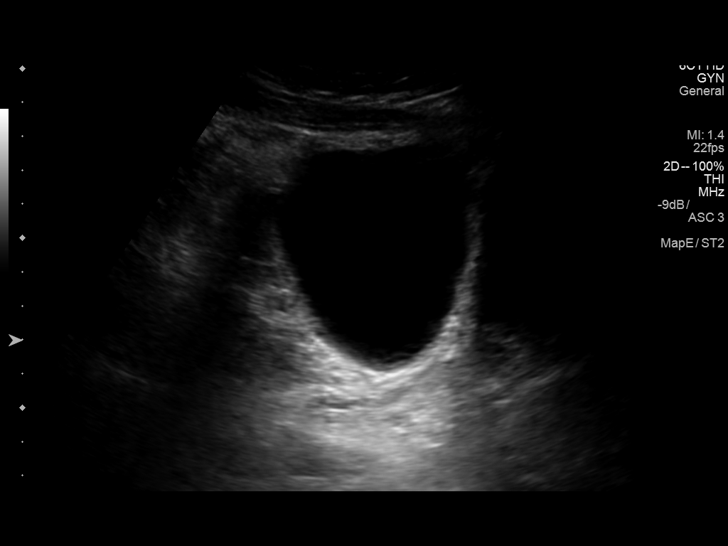
[im 39/47]
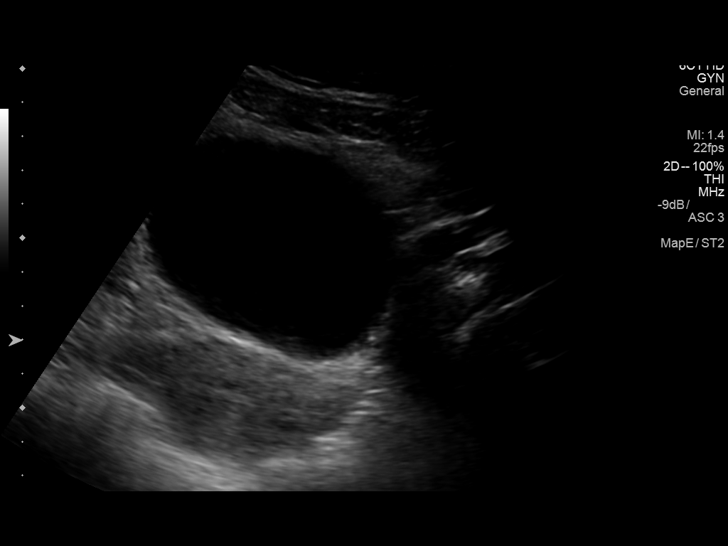
[im 43/47]
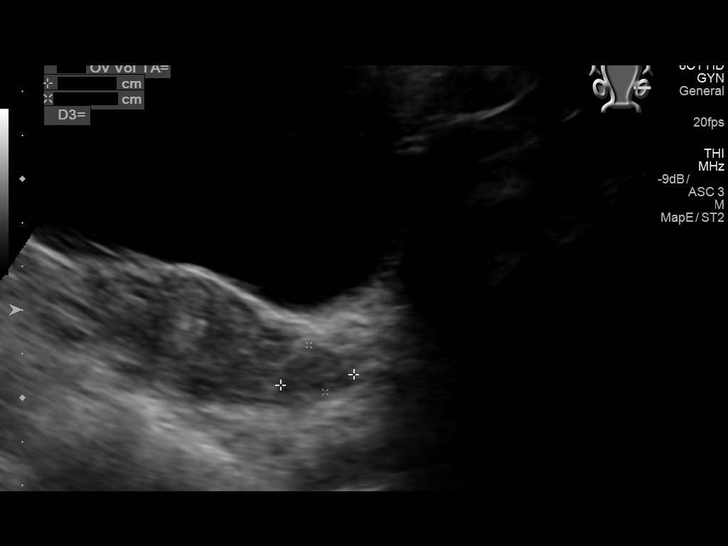
[im 47/47]
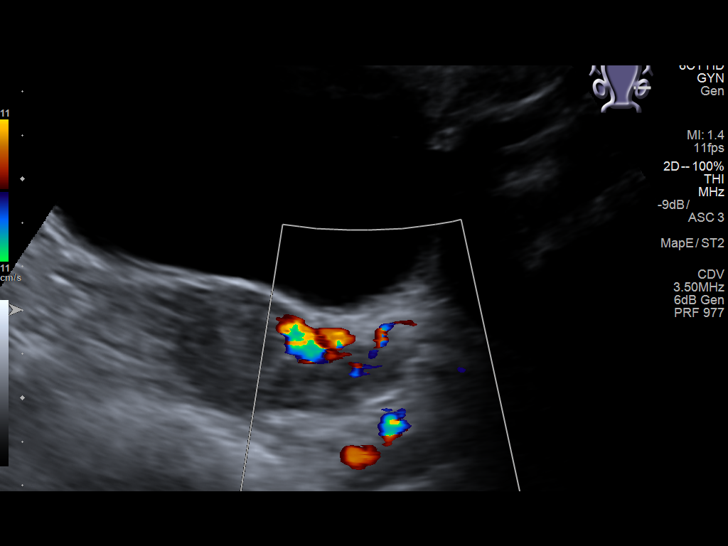

[14 of 25 positions shown; findings below may reference images not displayed]

FINDINGS: Uterus

Measurements: 3.4 x 4.9 cm. No fibroids or other mass visualized.

Endometrium

Thickness: 3 mm thickness within normal limits. No focal
abnormality.

Right ovary

Measurements: 2.8 x 1.6 x 1.8 cm. Normal appearance/no adnexal mass.

Left ovary

Measurements: 3.2 x 1.1 x 1.8 cm. Normal appearance/no adnexal mass.

Other findings:  No abnormal free fluid.
IMPRESSION: 1. Normal size uterus. Normal endometrial stripe thickness.
Unremarkable ovaries. No adnexal mass. No pelvic free fluid.

## 2019-02-12 ENCOUNTER — Other Ambulatory Visit: Payer: Self-pay | Admitting: Internal Medicine

## 2019-02-12 ENCOUNTER — Other Ambulatory Visit: Payer: Self-pay

## 2019-02-12 MED ORDER — NORETHIN ACE-ETH ESTRAD-FE 1-20 MG-MCG PO TABS: 1.0000 | ORAL_TABLET | Freq: Every day | ORAL | 0 refills | Status: DC

## 2019-02-12 NOTE — Telephone Encounter (Signed)
Requested Prescriptions  Pending Prescriptions Disp Refills  . norethindrone-ethinyl estradiol (JUNEL FE 1/20) 1-20 MG-MCG tablet 112 tablet 0    Sig: Take 1 tablet by mouth daily.     OB/GYN:  Contraceptives Passed - 02/12/2019 12:44 PM      Passed - Last BP in normal range    BP Readings from Last 1 Encounters:  03/20/18 102/64         Passed - Valid encounter within last 12 months    Recent Outpatient Visits          10 months ago Long-term use of high-risk medication   [facility] Sherlene Shams, MD   2 years ago Menorrhagia with regular cycle   [facility] [place] Sherlene Shams, MD   2 years ago B12 deficiency   [facility] Sherlene Shams, MD   2 years ago Excessive menstruation at puberty   [facility] Sherlene Shams, MD   3 years ago Encounter for immunization   [facility] Sherlene Shams, MD

## 2019-03-23 ENCOUNTER — Other Ambulatory Visit: Payer: Self-pay | Admitting: Internal Medicine

## 2019-06-25 ENCOUNTER — Telehealth: Payer: Self-pay

## 2019-06-25 MED ORDER — SERTRALINE HCL 50 MG PO TABS: 50.0000 mg | ORAL_TABLET | Freq: Every day | ORAL | 0 refills | Status: DC

## 2019-06-25 NOTE — Telephone Encounter (Signed)
Medication refilled

## 2019-08-05 ENCOUNTER — Encounter: Payer: BLUE CROSS/BLUE SHIELD | Admitting: Internal Medicine

## 2019-08-19 ENCOUNTER — Telehealth: Payer: Self-pay

## 2019-08-19 NOTE — Telephone Encounter (Signed)
Copied from [place] [id number]. Topic: General - Other >> Aug 19, 2019 11:37 AM Lennox Solders wrote: Reason for CRM:PT has an appt with dr Derrel Nip next month. Pt would like a written ESA letter to have emotional support cat due to anxiety. >> Aug 19, 2019 11:41 AM Lennox Solders wrote: ESA stands for emotional support animal

## 2019-09-15 ENCOUNTER — Encounter: Payer: BLUE CROSS/BLUE SHIELD | Admitting: Internal Medicine

## 2019-09-29 ENCOUNTER — Other Ambulatory Visit: Payer: Self-pay

## 2019-09-29 MED ORDER — SERTRALINE HCL 50 MG PO TABS: 50.0000 mg | ORAL_TABLET | Freq: Every day | ORAL | 0 refills | Status: DC

## 2019-10-08 ENCOUNTER — Other Ambulatory Visit: Payer: Self-pay

## 2019-10-08 MED ORDER — SERTRALINE HCL 50 MG PO TABS: 50.0000 mg | ORAL_TABLET | Freq: Every day | ORAL | 0 refills | Status: DC

## 2019-10-08 MED ORDER — NORETHIN ACE-ETH ESTRAD-FE 1-20 MG-MCG PO TABS: 1.0000 | ORAL_TABLET | Freq: Every day | ORAL | 0 refills | Status: DC

## 2019-10-08 NOTE — Telephone Encounter (Signed)
Refilled: 02/12/2019 Last OV: 03/20/2018 Next OV: 11/02/2019 Last labs: 03/20/2018

## 2019-11-02 ENCOUNTER — Encounter: Payer: Self-pay | Admitting: Internal Medicine

## 2019-11-02 ENCOUNTER — Ambulatory Visit (INDEPENDENT_AMBULATORY_CARE_PROVIDER_SITE_OTHER): Payer: BC Managed Care – PPO | Admitting: Internal Medicine

## 2019-11-02 ENCOUNTER — Other Ambulatory Visit: Payer: Self-pay

## 2019-11-02 VITALS — Ht 65.0 in | Wt 113.4 lb

## 2019-11-02 DIAGNOSIS — D649 Anemia, unspecified: Secondary | ICD-10-CM

## 2019-11-02 DIAGNOSIS — Z79899 Other long term (current) drug therapy: Secondary | ICD-10-CM | POA: Diagnosis not present

## 2019-11-02 DIAGNOSIS — D508 Other iron deficiency anemias: Secondary | ICD-10-CM

## 2019-11-02 DIAGNOSIS — N939 Abnormal uterine and vaginal bleeding, unspecified: Secondary | ICD-10-CM | POA: Diagnosis not present

## 2019-11-02 DIAGNOSIS — F411 Generalized anxiety disorder: Secondary | ICD-10-CM

## 2019-11-02 DIAGNOSIS — E538 Deficiency of other specified B group vitamins: Secondary | ICD-10-CM | POA: Diagnosis not present

## 2019-11-02 DIAGNOSIS — Z0000 Encounter for general adult medical examination without abnormal findings: Secondary | ICD-10-CM | POA: Diagnosis not present

## 2019-11-02 DIAGNOSIS — F410 Panic disorder [episodic paroxysmal anxiety] without agoraphobia: Secondary | ICD-10-CM

## 2019-11-02 MED ORDER — TANDEM 162-115.2 MG PO CAPS: 1.0000 | ORAL_CAPSULE | Freq: Every day | ORAL | 1 refills | Status: DC

## 2019-11-02 NOTE — Progress Notes (Signed)
Virtual Visit via [email or web address]  This visit type was conducted due to national recommendations for restrictions regarding the COVID-19 pandemic (e.g. social distancing).  This format is felt to be most appropriate for this patient at this time.  All issues noted in this document were discussed and addressed.  No physical exam was performed (except for noted visual exam findings with Video Visits).   I connected with@ on 11/02/19 at  3:00 PM EST by a video enabled telemedicine applicationand verified that I am speaking with the correct person using two identifiers. Location patient: home Location provider: work or home office Persons participating in the virtual visit: patient, provider  I discussed the limitations, risks, security and privacy concerns of performing an evaluation and management service by telephone and the availability of in person appointments. I also discussed with the patient that there may be a patient responsible charge related to this service. The patient expressed understanding and agreed to proceed.   Reason for visit: CPE HPI:  Patient ID: Heidi Goodwin, female    DOB: 06-24-1996  Age: 23 y.o. MRN: [id number]  The patient is here for annual Medicare wellness examination and management of other chronic and acute problems.   The risk factors are reflected in the social history.  The roster of all physicians providing medical care to patient - is listed in the Snapshot section of the chart.  Activities of daily living:  The patient is 100% independent in all ADLs: dressing, toileting, feeding as well as independent mobility  Home safety : The patient has smoke detectors in the home. They wear seatbelts.  There are no firearms at home. There is no violence in the home.   There is no risks for hepatitis, STDs or HIV. There is no   history of blood transfusion. They have no travel history to infectious disease endemic areas of the world.  The patient has seen their  dentist in the last six month. They have seen their eye doctor in the last year. They admit to slight hearing difficulty with regard to whispered voices and some television programs.  They have deferred audiologic testing in the last year.  They do not  have excessive sun exposure. Discussed the need for sun protection: hats, long sleeves and use of sunscreen if there is significant sun exposure.   Diet: the importance of a healthy diet is discussed. They do have a healthy diet.  The benefits of regular aerobic exercise were discussed. She walks 4 times per week ,  20 minutes.   Depression screen: there are no signs or vegative symptoms of depression- irritability, change in appetite, anhedonia, sadness/tearfullness.  Cognitive assessment: the patient manages all their financial and personal affairs and is actively engaged. They could relate day,date,year and events; recalled 2/3 objects at 3 minutes; performed clock-face test normally.  The following portions of the patient's history were reviewed and updated as appropriate: allergies, current medications, past family history, past medical history,  past surgical history, past social history  and problem list.  Visual acuity was not assessed per patient preference since she has regular follow up with her ophthalmologist. Hearing and body mass index were assessed and reviewed.   During the course of the visit the patient was educated and counseled about appropriate screening and preventive services including : fall prevention , diabetes screening, nutrition counseling, colorectal cancer screening, and recommended immunizations.    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  Referred to  GYN for treatment failure of menorrhagia. .  OCPS  Were continued since bleeding had improved , screening for blood dyscrasias done and negative.  Periods are now less heavy.  Stopped taking iron several months  ago.  Has been feeling tired and sleepy all the time, thinks she needs to resume it. 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   Major depressive disorder/ GAD: :  Managed with sertraline / alprazolam.  she adopted a cat because she was very lonely . Has grown very very fond of her cat and now needs  an Chief of Staff letter for apartment to allow her to have cat.   ROS: Patient denies headache, fevers, malaise, unintentional weight loss, skin rash, eye pain, sinus congestion and sinus pain, sore throat, dysphagia,  hemoptysis , cough, dyspnea, wheezing, chest pain, palpitations, orthopnea, edema, abdominal pain, nausea, melena, diarrhea, constipation, flank pain, dysuria, hematuria, urinary  Frequency, nocturia, numbness, tingling, seizures,  Focal weakness, Loss of consciousness,  Tremor, insomnia, depression, unmanaged anxiety, and suicidal ideation.    .  Past Medical History:  Diagnosis Date  . Abdominal discomfort 2016  . Frequent headaches 2015    Past Surgical History:  Procedure Laterality Date  . TONSILECTOMY/ADENOIDECTOMY WITH MYRINGOTOMY Bilateral 2009    Family History  Problem Relation Age of Onset  . Cancer Paternal Grandfather 38       Pancreatic    SOCIAL HX:  reports that she has never smoked. She has never used smokeless tobacco. She reports that she does not drink alcohol or use drugs.   Current Outpatient Medications:  .  norethindrone-ethinyl estradiol (JUNEL FE 1/20) 1-20 MG-MCG tablet, Take 1 tablet by mouth daily., Disp: 112 tablet, Rfl: 0 .  sertraline (ZOLOFT) 50 MG tablet, Take 1 tablet (50 mg total) by mouth daily., Disp: 90 tablet, Rfl: 0 .  ALPRAZolam (XANAX) 0.25 MG tablet, Take 1 tablet (0.25 mg total) by mouth daily as needed for anxiety. (Patient not taking: Reported on 11/02/2019), Disp: 30 tablet, Rfl: 0 .  cyanocobalamin (,VITAMIN B-12,) 1000 MCG/ML injection, Inject 1 mL  (1,000 mcg total) into the muscle once a week. Weekly for 3 weeks,  Then monthly (Patient not taking: Reported on 11/02/2019), Disp: 10 mL, Rfl: 0 .  ferrous fumarate-iron polysaccharide complex (TANDEM) 162-115.2 MG CAPS capsule, Take 1 capsule by mouth daily with breakfast., Disp: 90 capsule, Rfl: 1 .  INS SYRINGE/NEEDLE 1CC/28G (B-D INSULIN SYRINGE 1CC/28G) 28G X 1/2" 1 ML MISC, For use with B12 injections (Patient not taking: Reported on 11/02/2019), Disp: 10 each, Rfl: 1  EXAM:  VITALS per patient if applicable:  GENERAL: alert, oriented, appears well and in no acute distress  HEENT: atraumatic, conjunttiva clear, no obvious abnormalities on inspection of external nose and ears  NECK: normal movements of the head and neck  LUNGS: on inspection no signs of respiratory distress, breathing rate appears normal, no obvious gross SOB, gasping or wheezing  CV: no obvious cyanosis  MS: moves all visible extremities without noticeable abnormality  PSYCH/NEURO: pleasant and cooperative, no obvious depression or anxiety, speech and thought processing grossly intact  ASSESSMENT AND PLAN:  Discussed the following assessment and plan:  Encounter for preventive health examination  B12 deficiency - Plan: B12 and Folate Panel  Iron deficiency anemia secondary to inadequate dietary iron intake - Plan: CBC with Differential/Platelet, Iron, TIBC and Ferritin Panel  Long-term use of high-risk medication - Plan: Comprehensive metabolic panel, TSH  Abnormal uterine bleeding unrelated to menstrual cycle  Anxiety state  Panic anxiety syndrome  Fatigue associated with anemia  Abnormal uterine bleeding unrelated to menstrual cycle Improved wit change in OCPs to norethindrone -EE 1-20   Anxiety state Suboptimally managed with daily sertraline and prn alprazolam.  symptoms greatly improved since she adopted a cat.,  Letter for ESA requested and written   Encounter for preventive health  examination age appropriate education and counseling updated, referrals for preventative services and immunizations addressed, dietary and smoking counseling addressed, most recent labs reviewed.  I have personally reviewed and have noted:  1) the patient's medical and social history 2) The pt's use of alcohol, tobacco, and illicit drugs 3) The patient's current medications and supplements 4) Functional ability including ADL's, fall risk, home safety risk, hearing and visual impairment 5) Diet and physical activities 6) Evidence for depression or mood disorder 7) The patient's height, weight, and BMI have been recorded in the chart  I have made referrals, and provided counseling and education based on review of the above  Iron deficiency anemia History of  Secondary to menorrhagia.  Has not taken iron in months or had labs since May 2019.  Repeat levels needed   Lab Results  Component Value Date   WBC 5.3 03/20/2018   HGB 14.5 03/20/2018   HCT 42.8 03/20/2018   MCV 92.4 03/20/2018   PLT 259.0 03/20/2018    Lab Results  Component Value Date   IRON 146 03/20/2018   TIBC 461 (H) 03/20/2018   FERRITIN 15 03/20/2018     Panic anxiety syndrome zoloft and alprazolam refilled. The risks and benefits of benzodiazepine use were discussed with patient today including excessive sedation leading to respiratory depression,  impaired thinking/driving, and addiction.  Patient was advised to avoid concurrent use with alcohol, to use medication only as needed and not to share with others  .   Fatigue associated with anemia History of IRON AND B12 deficiencies with non adherence to regimens. rechecking today     I discussed the assessment and treatment plan with the patient. The patient was provided an opportunity to ask questions and all were answered. The patient agreed with the plan and demonstrated an understanding of the instructions.   The patient was advised to call back or seek an  in-person evaluation if the symptoms worsen or if the condition fails to improve as anticipated.   Sherlene Shamseresa L Treasure Ochs, MD

## 2019-11-03 NOTE — Assessment & Plan Note (Signed)
History of IRON AND B12 deficiencies with non adherence to regimens. rechecking today

## 2019-11-03 NOTE — Assessment & Plan Note (Signed)
zoloft and alprazolam refilled. The risks and benefits of benzodiazepine use were discussed with patient today including excessive sedation leading to respiratory depression,  impaired thinking/driving, and addiction.  Patient was advised to avoid concurrent use with alcohol, to use medication only as needed and not to share with others  .

## 2019-11-03 NOTE — Assessment & Plan Note (Signed)
History of  Secondary to menorrhagia.  Has not taken iron in months or had labs since May 2019.  Repeat levels needed   Lab Results  Component Value Date   WBC 5.3 03/20/2018   HGB 14.5 03/20/2018   HCT 42.8 03/20/2018   MCV 92.4 03/20/2018   PLT 259.0 03/20/2018    Lab Results  Component Value Date   IRON 146 03/20/2018   TIBC 461 (H) 03/20/2018   FERRITIN 15 03/20/2018

## 2019-11-03 NOTE — Assessment & Plan Note (Signed)

## 2019-11-03 NOTE — Assessment & Plan Note (Signed)
Improved wit change in OCPs to norethindrone -EE 1-20

## 2019-11-03 NOTE — Assessment & Plan Note (Signed)
Suboptimally managed with daily sertraline and prn alprazolam.  symptoms greatly improved since she adopted a cat.,  Letter for ESA requested and written

## 2019-11-04 ENCOUNTER — Other Ambulatory Visit: Payer: Self-pay

## 2019-11-10 ENCOUNTER — Other Ambulatory Visit (INDEPENDENT_AMBULATORY_CARE_PROVIDER_SITE_OTHER): Payer: BC Managed Care – PPO

## 2019-11-10 ENCOUNTER — Other Ambulatory Visit: Payer: Self-pay

## 2019-11-10 DIAGNOSIS — D508 Other iron deficiency anemias: Secondary | ICD-10-CM | POA: Diagnosis not present

## 2019-11-10 DIAGNOSIS — E538 Deficiency of other specified B group vitamins: Secondary | ICD-10-CM

## 2019-11-10 DIAGNOSIS — Z79899 Other long term (current) drug therapy: Secondary | ICD-10-CM

## 2019-11-11 LAB — IBC + FERRITIN
Ferritin: 4.5 ng/mL — ABNORMAL LOW (ref 10.0–291.0)
Iron: 107 ug/dL (ref 42–145)
Saturation Ratios: 19 % — ABNORMAL LOW (ref 20.0–50.0)
Transferrin: 402 mg/dL — ABNORMAL HIGH (ref 212.0–360.0)

## 2019-11-11 LAB — CBC WITH DIFFERENTIAL/PLATELET
Basophils Absolute: 0 10*3/uL (ref 0.0–0.1)
Basophils Relative: 0.5 % (ref 0.0–3.0)
Eosinophils Absolute: 0.2 10*3/uL (ref 0.0–0.7)
Eosinophils Relative: 3.1 % (ref 0.0–5.0)
HCT: 44.9 % (ref 36.0–46.0)
Hemoglobin: 15 g/dL (ref 12.0–15.0)
Lymphocytes Relative: 26.5 % (ref 12.0–46.0)
Lymphs Abs: 1.6 10*3/uL (ref 0.7–4.0)
MCHC: 33.4 g/dL (ref 30.0–36.0)
MCV: 91.6 fl (ref 78.0–100.0)
Monocytes Absolute: 0.5 10*3/uL (ref 0.1–1.0)
Monocytes Relative: 8.6 % (ref 3.0–12.0)
Neutro Abs: 3.7 10*3/uL (ref 1.4–7.7)
Neutrophils Relative %: 61.3 % (ref 43.0–77.0)
Platelets: 297 10*3/uL (ref 150.0–400.0)
RBC: 4.9 Mil/uL (ref 3.87–5.11)
RDW: 13.5 % (ref 11.5–15.5)
WBC: 6 10*3/uL (ref 4.0–10.5)

## 2019-11-11 LAB — COMPREHENSIVE METABOLIC PANEL
ALT: 15 U/L (ref 0–35)
AST: 20 U/L (ref 0–37)
Albumin: 4.5 g/dL (ref 3.5–5.2)
Alkaline Phosphatase: 63 U/L (ref 39–117)
BUN: 12 mg/dL (ref 6–23)
CO2: 29 mEq/L (ref 19–32)
Calcium: 9.8 mg/dL (ref 8.4–10.5)
Chloride: 103 mEq/L (ref 96–112)
Creatinine, Ser: 0.82 mg/dL (ref 0.40–1.20)
GFR: 87.84 mL/min (ref >60.00)
Glucose, Bld: 66 mg/dL — ABNORMAL LOW (ref 70–99)
Potassium: 4 mEq/L (ref 3.5–5.1)
Sodium: 140 mEq/L (ref 135–145)
Total Bilirubin: 0.4 mg/dL (ref 0.2–1.2)
Total Protein: 7.2 g/dL (ref 6.0–8.3)

## 2019-11-11 LAB — TSH: TSH: 0.57 u[IU]/mL (ref 0.35–4.50)

## 2019-11-11 LAB — B12 AND FOLATE PANEL
Folate: 15.8 ng/mL (ref >5.9)
Vitamin B-12: 251 pg/mL (ref 211–911)

## 2019-11-13 ENCOUNTER — Other Ambulatory Visit: Payer: Self-pay | Admitting: Internal Medicine

## 2019-11-13 DIAGNOSIS — E611 Iron deficiency: Secondary | ICD-10-CM

## 2019-11-13 DIAGNOSIS — E538 Deficiency of other specified B group vitamins: Secondary | ICD-10-CM

## 2019-11-20 ENCOUNTER — Telehealth: Payer: Self-pay | Admitting: Internal Medicine

## 2019-11-20 NOTE — Telephone Encounter (Signed)
Pt states that the letter for her to have her cat as a support animal is not being accepted at her apartment building. She needs it to have Dr. Melina Schools signature and license number on it. Please call pt back.

## 2019-11-20 NOTE — Telephone Encounter (Signed)
I have reprinted letter and placed in quick sign folder.

## 2019-11-23 NOTE — Telephone Encounter (Signed)
Pt called back and stated that she would like to have the letter emailed to her. Email verified and letter has been sent.

## 2019-11-23 NOTE — Telephone Encounter (Signed)
LMTCB. Need to find out how pt wants to get the letter, if she wants to pick it or if we need to mail it to her and what address if so.

## 2019-11-24 ENCOUNTER — Telehealth: Payer: Self-pay | Admitting: Internal Medicine

## 2019-11-24 NOTE — Telephone Encounter (Signed)
Pt was returning a call.  

## 2019-11-25 NOTE — Telephone Encounter (Signed)
Pt called back regarding a letter. Please advise and Thank you!  Call pt @ [phone]

## 2019-12-07 NOTE — Telephone Encounter (Signed)
Spoke with pt to see how she would like to get the letter. Pt stated that she would come by the office to pick it up today.

## 2019-12-07 NOTE — Telephone Encounter (Signed)
Pt mom called back and said that they never received the letter

## 2020-05-19 ENCOUNTER — Other Ambulatory Visit: Payer: Self-pay

## 2020-05-19 MED ORDER — NORETHIN ACE-ETH ESTRAD-FE 1-20 MG-MCG PO TABS: 1.0000 | ORAL_TABLET | Freq: Every day | ORAL | 0 refills | Status: DC

## 2020-05-25 ENCOUNTER — Telehealth: Payer: Self-pay | Admitting: Internal Medicine

## 2020-05-25 MED ORDER — SERTRALINE HCL 50 MG PO TABS: 50.0000 mg | ORAL_TABLET | Freq: Every day | ORAL | 0 refills | Status: DC

## 2020-05-25 NOTE — Telephone Encounter (Signed)
Pt needs refill on sertraline (ZOLOFT) 50 MG tablet. Please send to [facility] on [address]. Pt is out. Please call her at [phone]

## 2020-10-27 ENCOUNTER — Telehealth: Payer: Self-pay

## 2020-10-27 MED ORDER — SERTRALINE HCL 50 MG PO TABS: 50.0000 mg | ORAL_TABLET | Freq: Every day | ORAL | 0 refills | Status: DC

## 2020-10-27 MED ORDER — NORETHIN ACE-ETH ESTRAD-FE 1-20 MG-MCG PO TABS: 1.0000 | ORAL_TABLET | Freq: Every day | ORAL | 0 refills | Status: DC

## 2020-10-27 NOTE — Telephone Encounter (Signed)
Pt is out of norethindrone-ethinyl estradiol (JUNEL FE 1/20) 1-20 MG-MCG tablet and sertraline (ZOLOFT) 50 MG tablet . Please send to [facility] on [address] [facility] [address]

## 2021-01-24 ENCOUNTER — Other Ambulatory Visit: Payer: Self-pay

## 2021-01-24 MED ORDER — SERTRALINE HCL 50 MG PO TABS: 50.0000 mg | ORAL_TABLET | Freq: Every day | ORAL | 0 refills | Status: DC

## 2021-01-24 MED ORDER — NORETHIN ACE-ETH ESTRAD-FE 1-20 MG-MCG PO TABS: 1.0000 | ORAL_TABLET | Freq: Every day | ORAL | 0 refills | Status: DC

## 2021-06-02 ENCOUNTER — Telehealth: Payer: Self-pay

## 2021-06-02 DIAGNOSIS — N920 Excessive and frequent menstruation with regular cycle: Secondary | ICD-10-CM

## 2021-06-02 DIAGNOSIS — R102 Pelvic and perineal pain: Secondary | ICD-10-CM

## 2021-06-02 NOTE — Telephone Encounter (Signed)
Pt called and states that she was receiving a rx of Aurovela through a doctor at [facility] but that Dr is no longer there so she wants to know if Dr Darrick Huntsman would start refilling that for her. Pt did not know the mg or dosage of medication. Please advise.

## 2021-06-02 NOTE — Telephone Encounter (Signed)
Spoke with pt and she stated that she would like to go forward with the referral to [facility].

## 2021-06-02 NOTE — Telephone Encounter (Signed)
Referral in progress to [facility] GYN

## 2021-06-05 ENCOUNTER — Other Ambulatory Visit: Payer: Self-pay | Admitting: Internal Medicine

## 2021-06-05 NOTE — Telephone Encounter (Signed)
Pt is aware.  

## 2021-08-16 ENCOUNTER — Other Ambulatory Visit: Payer: Self-pay

## 2021-08-16 ENCOUNTER — Ambulatory Visit (INDEPENDENT_AMBULATORY_CARE_PROVIDER_SITE_OTHER): Payer: BC Managed Care – PPO | Admitting: Internal Medicine

## 2021-08-16 ENCOUNTER — Encounter: Payer: Self-pay | Admitting: Internal Medicine

## 2021-08-16 VITALS — BP 100/74 | HR 76 | Temp 96.9°F | Ht 65.0 in | Wt 160.0 lb

## 2021-08-16 DIAGNOSIS — Z0000 Encounter for general adult medical examination without abnormal findings: Secondary | ICD-10-CM

## 2021-08-16 DIAGNOSIS — E538 Deficiency of other specified B group vitamins: Secondary | ICD-10-CM

## 2021-08-16 DIAGNOSIS — D508 Other iron deficiency anemias: Secondary | ICD-10-CM

## 2021-08-16 DIAGNOSIS — Z23 Encounter for immunization: Secondary | ICD-10-CM

## 2021-08-16 DIAGNOSIS — N920 Excessive and frequent menstruation with regular cycle: Secondary | ICD-10-CM

## 2021-08-16 DIAGNOSIS — R635 Abnormal weight gain: Secondary | ICD-10-CM

## 2021-08-16 DIAGNOSIS — D649 Anemia, unspecified: Secondary | ICD-10-CM

## 2021-08-16 DIAGNOSIS — N939 Abnormal uterine and vaginal bleeding, unspecified: Secondary | ICD-10-CM

## 2021-08-16 DIAGNOSIS — F411 Generalized anxiety disorder: Secondary | ICD-10-CM | POA: Diagnosis not present

## 2021-08-16 DIAGNOSIS — E559 Vitamin D deficiency, unspecified: Secondary | ICD-10-CM

## 2021-08-16 DIAGNOSIS — F410 Panic disorder [episodic paroxysmal anxiety] without agoraphobia: Secondary | ICD-10-CM

## 2021-08-16 MED ORDER — SERTRALINE HCL 100 MG PO TABS: 100.0000 mg | ORAL_TABLET | Freq: Every day | ORAL | 1 refills | Status: DC

## 2021-08-16 MED ORDER — NORETHIN ACE-ETH ESTRAD-FE 1-20 MG-MCG PO TABS: 1.0000 | ORAL_TABLET | Freq: Every day | ORAL | 0 refills | Status: DC

## 2021-08-16 NOTE — Assessment & Plan Note (Addendum)
Some obsessive thoughts every night   increase zoloft to 100 mg ,  Agree with use of emotional support animal (her cat)

## 2021-08-16 NOTE — Progress Notes (Signed)
Patient ID: Heidi Goodwin, female    DOB: 06-Mar-1996  Age: 25 y.o. MRN: [id number]  The patient is here for annual preventive examination and management of other chronic and acute problems.  This visit occurred during the SARS-CoV-2 public health emergency.  Safety protocols were in place, including screening questions prior to the visit, additional usage of staff PPE, and extensive cleaning of exam room while observing appropriate contact time as indicated for disinfecting solutions.   The risk factors are reflected in the social history.  The roster of all physicians providing medical care to patient - is listed in the Snapshot section of the chart.  Activities of daily living:  The patient is 100% independent in all ADLs: dressing, toileting, feeding as well as independent mobility  Home safety : The patient has smoke detectors in the home. They wear seatbelts.  There are no firearms at home. There is no violence in the home.   There is no risks for hepatitis, STDs or HIV. There is no   history of blood transfusion. They have no travel history to infectious disease endemic areas of the world.  The patient has seen their dentist in the last six month. They have seen their eye doctor in the last year. They admit to slight hearing difficulty with regard to whispered voices and some television programs.  They have deferred audiologic testing in the last year.  They do not  have excessive sun exposure. Discussed the need for sun protection: hats, long sleeves and use of sunscreen if there is significant sun exposure.   Diet: the importance of a healthy diet is discussed. They do have a healthy diet.  The benefits of regular aerobic exercise were discussed. She is exercising 3 to 4 times per week , 60  minutes.   Depression screen: there are no signs or vegative symptoms of depression- irritability, change in appetite, anhedonia, sadness/tearfullness.  Cognitive assessment: the patient  manages all their financial and personal affairs and is actively engaged. They could relate day,date,year and events; recalled 2/3 objects at 3 minutes; performed clock-face test normally.  The following portions of the patient's history were reviewed and updated as appropriate: allergies, current medications, past family history, past medical history,  past surgical history, past social history  and problem list.  Visual acuity was not assessed per patient preference since she has regular follow up with her ophthalmologist. Hearing and body mass index were assessed and reviewed.   During the course of the visit the patient was educated and counseled about appropriate screening and preventive services including : fall prevention , diabetes screening, nutrition counseling, colorectal cancer screening, and recommended immunizations.    CC: The primary encounter diagnosis was Encounter for preventive health examination. Diagnoses of Anxiety state, Menorrhagia with regular cycle, B12 deficiency, Fatigue associated with anemia, Other iron deficiency anemia, Weight gain, Vitamin D deficiency, Need for immunization against influenza, Abnormal uterine bleeding unrelated to menstrual cycle, and Panic anxiety syndrome were also pertinent to this visit.   GAD:  Has an emotional support  Cat named Joelene Millin ,  x 2 years .  Anxiety improving . Graduated from college.  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 While she was working their, her Set designer was stolen .  She returned to [facility]  History Heidi Goodwin has a past medical history of Abdominal discomfort (2016) and Frequent headaches (2015).   She has a past surgical history that includes Tonsilectomy/adenoidectomy with myringotomy (Bilateral, 2009).  Her family history includes Cancer (age of onset: 55) in her paternal grandfather.She reports that she has never smoked. She has never used smokeless tobacco. She reports that she does  not drink alcohol and does not use drugs.  Outpatient Medications Prior to Visit  Medication Sig Dispense Refill   sertraline (ZOLOFT) 50 MG tablet TAKE 1 TABLET(50 MG) BY MOUTH DAILY 90 tablet 0   INS SYRINGE/NEEDLE 1CC/28G (B-D INSULIN SYRINGE 1CC/28G) 28G X 1/2" 1 ML MISC For use with B12 injections (Patient not taking: No sig reported) 10 each 1   ALPRAZolam (XANAX) 0.25 MG tablet Take 1 tablet (0.25 mg total) by mouth daily as needed for anxiety. (Patient not taking: No sig reported) 30 tablet 0   cyanocobalamin (,VITAMIN B-12,) 1000 MCG/ML injection Inject 1 mL (1,000 mcg total) into the muscle once a week. Weekly for 3 weeks,  Then monthly (Patient not taking: No sig reported) 10 mL 0   ferrous fumarate-iron polysaccharide complex (TANDEM) 162-115.2 MG CAPS capsule Take 1 capsule by mouth daily with breakfast. (Patient not taking: Reported on 08/16/2021) 90 capsule 1   norethindrone-ethinyl estradiol (JUNEL FE 1/20) 1-20 MG-MCG tablet Take 1 tablet by mouth daily. (Patient not taking: Reported on 08/16/2021) 112 tablet 0   No facility-administered medications prior to visit.    Review of Systems  Patient denies headache, fevers, malaise, unintentional weight loss, skin rash, eye pain, sinus congestion and sinus pain, sore throat, dysphagia,  hemoptysis , cough, dyspnea, wheezing, chest pain, palpitations, orthopnea, edema, abdominal pain, nausea, melena, diarrhea, constipation, flank pain, dysuria, hematuria, urinary  Frequency, nocturia, numbness, tingling, seizures,  Focal weakness, Loss of consciousness,  Tremor, insomnia, depression,  and suicidal ideation.     Objective:  BP 100/74 (BP Location: Left Arm, Patient Position: Sitting, Cuff Size: Normal)   Pulse 76   Temp (!) 96.9 F (36.1 C) (Temporal)   Ht 5\' 5"  (1.651 m)   Wt 160 lb (72.6 kg)   LMP 07/26/2021 (Approximate)   SpO2 99%   BMI 26.63 kg/m   Physical Exam  General appearance: alert, cooperative and appears  stated age Head: Normocephalic, without obvious abnormality, atraumatic Eyes: conjunctivae/corneas clear. PERRL, EOM's intact. Fundi benign. Ears: normal TM's and external ear canals both ears Nose: Nares normal. Septum midline. Mucosa normal. No drainage or sinus tenderness. Throat: lips, mucosa, and tongue normal; teeth and gums normal Neck: no adenopathy, no carotid bruit, no JVD, supple, symmetrical, trachea midline and thyroid not enlarged, symmetric, no tenderness/mass/nodules Lungs: clear to auscultation bilaterally Breasts: normal appearance, no masses or tenderness Heart: regular rate and rhythm, S1, S2 normal, no murmur, click, rub or gallop Abdomen: soft, non-tender; bowel sounds normal; no masses,  no organomegaly Extremities: extremities normal, atraumatic, no cyanosis or edema Pulses: 2+ and symmetric Skin: Skin color, texture, turgor normal. No rashes or lesions Neurologic: Alert and oriented X 3, normal strength and tone. Normal symmetric reflexes. Normal coordination and gait.     Assessment & Plan:   Problem List Items Addressed This Visit       Unprioritized   Iron deficiency anemia    Not  Currently taking iron,  Rechecking today         Relevant Orders   CBC with Differential/Platelet   Iron, TIBC and Ferritin Panel   RESOLVED: Fatigue associated with anemia   B12 deficiency    Repeatedly low, without anemia, her deficiency was managed with weekly injections x 3, then monthly. INtrinsic factor antibody was negative.  She  has not taken any supplements in several months        Relevant Orders   Vitamin B12   Panic anxiety syndrome    zoloft refilled at higher dose.  Also benefits from companionship of emotional support animal      Relevant Medications   sertraline (ZOLOFT) 100 MG tablet   Anxiety state    Some obsessive thoughts every night   increase zoloft to 100 mg ,  Agree with use of emotional support animal (her cat)      Relevant Medications    sertraline (ZOLOFT) 100 MG tablet   RESOLVED: Menorrhagia with regular cycle   Abnormal uterine bleeding unrelated to menstrual cycle    S/p gynecology evaluation.  Normal exam.  Resume oral contraception to regulate periods       Encounter for preventive health examination - Primary    age appropriate education and counseling updated, referrals for preventative services and immunizations addressed, dietary and smoking counseling addressed, most recent labs reviewed.  I have personally reviewed and have noted:   1) the patient's medical and social history 2) The pt's use of alcohol, tobacco, and illicit drugs 3) The patient's current medications and supplements 4) Functional ability including ADL's, fall risk, home safety risk, hearing and visual impairment 5) Diet and physical activities 6) Evidence for depression or mood disorder 7) The patient's height, weight, and BMI have been recorded in the chart   I have made referrals, and provided counseling and education based on review of the above      Other Visit Diagnoses     Weight gain       Relevant Orders   Comprehensive metabolic panel   Lipid panel   TSH   Vitamin D deficiency       Relevant Orders   VITAMIN D 25 Hydroxy (Vit-D Deficiency, Fractures)   Need for immunization against influenza       Relevant Orders   Flu Vaccine QUAD 30mo+IM (Fluarix, Fluzone & Alfiuria Quad PF) (Completed)       I have discontinued Layne Benton "Lieza"'s cyanocobalamin, ALPRAZolam, and Tandem. I have also changed her sertraline and norethindrone-ethinyl estradiol-FE. Additionally, I am having her maintain her INS SYRINGE/NEEDLE 1CC/28G.  Meds ordered this encounter  Medications   sertraline (ZOLOFT) 100 MG tablet    Sig: Take 1 tablet (100 mg total) by mouth at bedtime.    Dispense:  90 tablet    Refill:  1   norethindrone-ethinyl estradiol-FE (JUNEL FE 1/20) 1-20 MG-MCG tablet    Sig: Take 1 tablet by mouth daily.     Dispense:  112 tablet    Refill:  0    Medications Discontinued During This Encounter  Medication Reason   sertraline (ZOLOFT) 50 MG tablet    norethindrone-ethinyl estradiol (JUNEL FE 1/20) 1-20 MG-MCG tablet Reorder   ferrous fumarate-iron polysaccharide complex (TANDEM) 162-115.2 MG CAPS capsule    cyanocobalamin (,VITAMIN B-12,) 1000 MCG/ML injection    ALPRAZolam (XANAX) 0.25 MG tablet     Follow-up: No follow-ups on file.   Sherlene Shams, MD

## 2021-08-16 NOTE — Patient Instructions (Signed)
I have increased your sertraline dose to 100 mg daily for the anxiety   I have refilled the birth control to help regulate your periods   Do NOT start both at the same time!  ( One week apart to avoid nausea)  Return for labs after a 4 hour fast (no greasy food that morning)

## 2021-08-17 ENCOUNTER — Encounter: Payer: Self-pay | Admitting: Internal Medicine

## 2021-08-17 NOTE — Assessment & Plan Note (Addendum)
Repeatedly low, without anemia, her deficiency was managed with weekly injections x 3, then monthly. INtrinsic factor antibody was negative.  She has not taken any supplements in several months

## 2021-08-17 NOTE — Assessment & Plan Note (Signed)
S/p gynecology evaluation.  Normal exam.  Resume oral contraception to regulate periods

## 2021-08-17 NOTE — Assessment & Plan Note (Signed)

## 2021-08-17 NOTE — Assessment & Plan Note (Signed)
Not  Currently taking iron,  Rechecking today

## 2021-08-17 NOTE — Assessment & Plan Note (Signed)
zoloft refilled at higher dose.  Also benefits from companionship of emotional support animal

## 2021-08-31 ENCOUNTER — Telehealth: Payer: Self-pay | Admitting: Internal Medicine

## 2021-08-31 NOTE — Telephone Encounter (Signed)
Rejection Reason - Patient did not respond" Particia Jasper said on Aug 31, 2021 10:33 AM  "Patient Called 3 times and letter sent out. Patient to call and schedule. 08/23/21 11:15 am. " Shanda Bumps English said on Aug 23, 2021 11:14 AM  "Anderson Malta. left message to call and schedule." Particia Jasper said on Aug 01, 2021 10:04 AM  Msg from [facility] ob/gyn

## 2022-02-24 ENCOUNTER — Other Ambulatory Visit: Payer: Self-pay | Admitting: Internal Medicine

## 2022-05-29 ENCOUNTER — Ambulatory Visit: Payer: BC Managed Care – PPO | Admitting: Internal Medicine

## 2022-05-29 ENCOUNTER — Encounter: Payer: Self-pay | Admitting: Internal Medicine

## 2022-05-29 DIAGNOSIS — R635 Abnormal weight gain: Secondary | ICD-10-CM | POA: Diagnosis not present

## 2022-05-29 DIAGNOSIS — R42 Dizziness and giddiness: Secondary | ICD-10-CM | POA: Insufficient documentation

## 2022-05-29 DIAGNOSIS — R55 Syncope and collapse: Secondary | ICD-10-CM

## 2022-05-29 DIAGNOSIS — E559 Vitamin D deficiency, unspecified: Secondary | ICD-10-CM | POA: Diagnosis not present

## 2022-05-29 DIAGNOSIS — E538 Deficiency of other specified B group vitamins: Secondary | ICD-10-CM

## 2022-05-29 DIAGNOSIS — D508 Other iron deficiency anemias: Secondary | ICD-10-CM

## 2022-05-29 DIAGNOSIS — F410 Panic disorder [episodic paroxysmal anxiety] without agoraphobia: Secondary | ICD-10-CM

## 2022-05-29 LAB — LIPID PANEL
Cholesterol: 171 mg/dL (ref 0–200)
HDL: 69 mg/dL (ref >39.00)
LDL Cholesterol: 91 mg/dL (ref 0–99)
NonHDL: 101.75
Total CHOL/HDL Ratio: 2
Triglycerides: 56 mg/dL (ref 0.0–149.0)
VLDL: 11.2 mg/dL (ref 0.0–40.0)

## 2022-05-29 LAB — COMPREHENSIVE METABOLIC PANEL
ALT: 11 U/L (ref 0–35)
AST: 14 U/L (ref 0–37)
Albumin: 4.8 g/dL (ref 3.5–5.2)
Alkaline Phosphatase: 65 U/L (ref 39–117)
BUN: 13 mg/dL (ref 6–23)
CO2: 26 mEq/L (ref 19–32)
Calcium: 9.8 mg/dL (ref 8.4–10.5)
Chloride: 104 mEq/L (ref 96–112)
Creatinine, Ser: 0.72 mg/dL (ref 0.40–1.20)
GFR: 117.59 mL/min (ref >60.00)
Glucose, Bld: 81 mg/dL (ref 70–99)
Potassium: 4.1 mEq/L (ref 3.5–5.1)
Sodium: 139 mEq/L (ref 135–145)
Total Bilirubin: 0.6 mg/dL (ref 0.2–1.2)
Total Protein: 7.2 g/dL (ref 6.0–8.3)

## 2022-05-29 LAB — CBC WITH DIFFERENTIAL/PLATELET
Basophils Absolute: 0 10*3/uL (ref 0.0–0.1)
Basophils Relative: 0.6 % (ref 0.0–3.0)
Eosinophils Absolute: 0.1 10*3/uL (ref 0.0–0.7)
Eosinophils Relative: 1.4 % (ref 0.0–5.0)
HCT: 43.9 % (ref 36.0–46.0)
Hemoglobin: 14.7 g/dL (ref 12.0–15.0)
Lymphocytes Relative: 20.2 % (ref 12.0–46.0)
Lymphs Abs: 1.3 10*3/uL (ref 0.7–4.0)
MCHC: 33.5 g/dL (ref 30.0–36.0)
MCV: 95.6 fl (ref 78.0–100.0)
Monocytes Absolute: 0.5 10*3/uL (ref 0.1–1.0)
Monocytes Relative: 7 % (ref 3.0–12.0)
Neutro Abs: 4.6 10*3/uL (ref 1.4–7.7)
Neutrophils Relative %: 70.8 % (ref 43.0–77.0)
Platelets: 329 10*3/uL (ref 150.0–400.0)
RBC: 4.59 Mil/uL (ref 3.87–5.11)
RDW: 12.9 % (ref 11.5–15.5)
WBC: 6.5 10*3/uL (ref 4.0–10.5)

## 2022-05-29 LAB — TSH: TSH: 0.82 u[IU]/mL (ref 0.35–5.50)

## 2022-05-29 LAB — VITAMIN B12: Vitamin B-12: 246 pg/mL (ref 211–911)

## 2022-05-29 LAB — VITAMIN D 25 HYDROXY (VIT D DEFICIENCY, FRACTURES): VITD: 30.27 ng/mL (ref 30.00–100.00)

## 2022-05-29 MED ORDER — BUSPIRONE HCL 15 MG PO TABS: ORAL_TABLET | ORAL | 1 refills | Status: DC

## 2022-05-29 NOTE — Assessment & Plan Note (Signed)
Stopping SSRI due to recurrent episodes of dizziness,,  Dyspnea and nausea associated with taking of medication. Trial of buspirone

## 2022-05-29 NOTE — Assessment & Plan Note (Addendum)
Intermittent. But associated with use of sertraline.  She is Not orthostatic today.  Overdue for labs to follow up on IDA and B12 deficiency

## 2022-05-29 NOTE — Patient Instructions (Signed)
STOP TAKING SERTRALINE   Start Buspirone,  first dose this afternoon and tonight  You can use it 2 to 3 times daily instead of sertraline   If the episodes continue,  notify me for further workup  If they stop,  but your anxiety is not controlled, notify me and I will increase the dose

## 2022-05-29 NOTE — Progress Notes (Signed)
Subjective:  Patient ID: Heidi Goodwin, female    DOB: 24-Oct-1996  Age: 26 y.o. MRN: [id number]  CC: Diagnoses of Weight gain, Other iron deficiency anemia, B12 deficiency, Vitamin D deficiency, Panic anxiety syndrome, and Postural dizziness with presyncope were pertinent to this visit.   HPI Heidi Goodwin presents for follow up on anxiety Chief Complaint  Patient presents with   Follow-up    Follow up to discuss medications    GAD:  Heidi Goodwin was last  seen in October for CPE and her sertraline dose was increased to 100 mg for persistent anxiety .related to transitioning back to [name] .   She states that for the past several months she frequently  feels like she is going to pass out  about 30 minutes to an hour after taking the medication. It Does not occur every day ,  but does ont occur on days she has suspended sertraline, AND OCCURS at least 4 days out of 7   for the past several months.  Symptoms including feeling  Dizzy,  short of breath ,  and nauseated,  symptoms last for approximately 3 to 4 hours.   She has not passed out .  She has had an intentional weight loss of 35 lbs since October. She denies use of recreational drugs and alcohol.   She continues to have heavy periods, and has stopped using OCPs to control them.     Outpatient Medications Prior to Visit  Medication Sig Dispense Refill   sertraline (ZOLOFT) 100 MG tablet TAKE 1 TABLET(100 MG) BY MOUTH AT BEDTIME 90 tablet 1   INS SYRINGE/NEEDLE 1CC/28G (B-D INSULIN SYRINGE 1CC/28G) 28G X 1/2" 1 ML MISC For use with B12 injections (Patient not taking: No sig reported) 10 each 1   norethindrone-ethinyl estradiol-FE (JUNEL FE 1/20) 1-20 MG-MCG tablet Take 1 tablet by mouth daily. (Patient not taking: Reported on 05/29/2022) 112 tablet 0   No facility-administered medications prior to visit.    Review of Systems;  Patient denies headache, fevers, malaise, unintentional weight loss, skin rash, eye pain,  sinus congestion and sinus pain, sore throat, dysphagia,  hemoptysis , cough, dyspnea, wheezing, chest pain, palpitations, orthopnea, edema, abdominal pain, nausea, melena, diarrhea, constipation, flank pain, dysuria, hematuria, urinary  Frequency, nocturia, numbness, tingling, seizures,  Focal weakness, Loss of consciousness,  Tremor, insomnia, depression, anxiety, and suicidal ideation.      Objective:  Temp 98.1 F (36.7 C) (Oral)   Ht 5\' 5"  (1.651 m)   Wt 123 lb 6.4 oz (56 kg)   SpO2 99%   BMI 20.53 kg/m   (not orthostatic by repeat testing)  BP Readings from Last 3 Encounters:  08/16/21 100/74  03/20/18 102/64  02/12/17 112/74    Wt Readings from Last 3 Encounters:  05/29/22 123 lb 6.4 oz (56 kg)  08/16/21 160 lb (72.6 kg)  11/02/19 113 lb 6.4 oz (51.4 kg)    General appearance: alert, cooperative and appears stated age Ears: normal TM's and external ear canals both ears Throat: lips, mucosa, and tongue normal; teeth and gums normal Neck: no adenopathy, no carotid bruit, supple, symmetrical, trachea midline and thyroid not enlarged, symmetric, no tenderness/mass/nodules Back: symmetric, no curvature. ROM normal. No CVA tenderness. Lungs: clear to auscultation bilaterally Heart: regular rate and rhythm, S1, S2 normal, no murmur, click, rub or gallop Abdomen: soft, non-tender; bowel sounds normal; no masses,  no organomegaly Pulses: 2+ and symmetric Skin: Skin color, texture, turgor normal. No rashes or lesions  Lymph nodes: Cervical, supraclavicular, and axillary nodes normal.  No results found for: "HGBA1C"  Lab Results  Component Value Date   CREATININE 0.72 05/29/2022   CREATININE 0.82 11/10/2019   CREATININE 0.79 03/20/2018    Lab Results  Component Value Date   WBC 6.5 05/29/2022   HGB 14.7 05/29/2022   HCT 43.9 05/29/2022   PLT 329.0 05/29/2022   GLUCOSE 81 05/29/2022   CHOL 171 05/29/2022   TRIG 56.0 05/29/2022   HDL 69.00 05/29/2022   LDLCALC 91  05/29/2022   ALT 11 05/29/2022   AST 14 05/29/2022   NA 139 05/29/2022   K 4.1 05/29/2022   CL 104 05/29/2022   CREATININE 0.72 05/29/2022   BUN 13 05/29/2022   CO2 26 05/29/2022   TSH 0.82 05/29/2022   INR 1.0 02/14/2017    US Pelvis Complete  Result Date: 02/20/2017 CLINICAL DATA:  Intermittent vaginal bleeding starting 01/27/2017, virginal patient on OCP EXAM: TRANSABDOMINAL ULTRASOUND OF PELVIS TECHNIQUE: Transabdominal ultrasound examination of the pelvis was performed including evaluation of the uterus, ovaries, adnexal regions, and pelvic cul-de-sac. COMPARISON:  None. FINDINGS: Uterus Measurements: 3.4 x 4.9 cm. No fibroids or other mass visualized. Endometrium Thickness: 3 mm thickness within normal limits. No focal abnormality. Right ovary Measurements: 2.8 x 1.6 x 1.8 cm. Normal appearance/no adnexal mass. Left ovary Measurements: 3.2 x 1.1 x 1.8 cm. Normal appearance/no adnexal mass. Other findings:  No abnormal free fluid. IMPRESSION: 1. Normal size uterus. Normal endometrial stripe thickness. Unremarkable ovaries. No adnexal mass. No pelvic free fluid. Electronically Signed   By: Natasha Mead M.D.   On: 02/20/2017 16:13    Assessment & Plan:   Problem List Items Addressed This Visit     Panic anxiety syndrome    Stopping SSRI due to recurrent episodes of dizziness,,  Dyspnea and nausea associated with taking of medication. Trial of buspirone       Relevant Medications   busPIRone (BUSPAR) 15 MG tablet   B12 deficiency   Iron deficiency anemia   Postural dizziness with presyncope    Intermittent. But associated with use of sertraline.  She is Not orthostatic today.  Overdue for labs to follow up on IDA and B12 deficiency      Other Visit Diagnoses     Weight gain       Vitamin D deficiency           I spent a total of  26  minutes with this patient in a face to face visit on the date of this encounter reviewing the last office visit with me in October,   patient'ss diet and eating habits,  and post visit ordering of testing and therapeutics.    Follow-up: No follow-ups on file.   Sherlene Shams, MD

## 2022-05-30 LAB — IRON,TIBC AND FERRITIN PANEL
%SAT: 33 % (calc) (ref 16–45)
Ferritin: 21 ng/mL (ref 16–154)
Iron: 127 ug/dL (ref 40–190)
TIBC: 389 mcg/dL (calc) (ref 250–450)

## 2022-06-20 ENCOUNTER — Telehealth: Payer: Self-pay | Admitting: Internal Medicine

## 2022-06-20 NOTE — Telephone Encounter (Signed)
I will update if okay?

## 2022-06-20 NOTE — Telephone Encounter (Signed)
Pt called in stating the provider wrote her an emotional support letter a couple of years ago for her cat and wants to know if it can be updated

## 2022-06-25 NOTE — Telephone Encounter (Signed)
Printed and placed in quick sign folder 

## 2022-06-26 NOTE — Telephone Encounter (Signed)
Spoke with pt and she stated that she was able to print it from her mychart. She stated if she needs a signed copy she will let us know.

## 2022-08-17 ENCOUNTER — Encounter: Payer: BC Managed Care – PPO | Admitting: Internal Medicine

## 2022-08-24 ENCOUNTER — Encounter: Payer: Self-pay | Admitting: Internal Medicine

## 2022-09-04 ENCOUNTER — Ambulatory Visit (INDEPENDENT_AMBULATORY_CARE_PROVIDER_SITE_OTHER): Payer: BC Managed Care – PPO | Admitting: Internal Medicine

## 2022-09-04 ENCOUNTER — Encounter: Payer: Self-pay | Admitting: Internal Medicine

## 2022-09-04 VITALS — BP 100/70 | HR 76 | Temp 97.7°F | Ht 65.0 in | Wt 124.6 lb

## 2022-09-04 DIAGNOSIS — Z23 Encounter for immunization: Secondary | ICD-10-CM | POA: Diagnosis not present

## 2022-09-04 DIAGNOSIS — F410 Panic disorder [episodic paroxysmal anxiety] without agoraphobia: Secondary | ICD-10-CM

## 2022-09-04 DIAGNOSIS — D508 Other iron deficiency anemias: Secondary | ICD-10-CM

## 2022-09-04 DIAGNOSIS — E538 Deficiency of other specified B group vitamins: Secondary | ICD-10-CM | POA: Diagnosis not present

## 2022-09-04 MED ORDER — PROPRANOLOL HCL 10 MG PO TABS: 5.0000 mg | ORAL_TABLET | Freq: Every day | ORAL | 0 refills | Status: DC | PRN

## 2022-09-04 MED ORDER — PAROXETINE HCL 10 MG PO TABS: 10.0000 mg | ORAL_TABLET | Freq: Every day | ORAL | 1 refills | Status: DC

## 2022-09-04 NOTE — Assessment & Plan Note (Signed)
She has t=started taking a vitamin B12 supplement

## 2022-09-04 NOTE — Progress Notes (Signed)
Subjective:  Patient ID: Heidi Goodwin, female    DOB: 01/07/1996  Age: 26 y.o. MRN: [id number]  CC: The primary encounter diagnosis was Need for immunization against influenza. Diagnoses of Panic anxiety syndrome, B12 deficiency, Other iron deficiency anemia, and Need for Tdap vaccination were also pertinent to this visit.   HPI Heidi Goodwin presents for follow up on GAD previously managed with medications and an EMA (her cat)  Chief Complaint  Patient presents with   Follow-up    Follow up to discuss medications   1) GAD with panic attacks.  She stopped sertraline in July due to S/E of recurrent dizziness , and stopped the buspirone a week ago.  She is requesting a trial of paroxetine, based on her mother's favorable response to this SSRI. She reports feeling anxious all the time , mostly due to fear of not being able to support herself.  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  She likes her job and the people at work,  but is afraid to ask for feedback on her performance. She has never had talk therapy and is interested in starting therapy . Her panic attacks are hallmarked by rapid heart rate,  numbness of lips and fingertips     Outpatient Medications Prior to Visit  Medication Sig Dispense Refill   busPIRone (BUSPAR) 15 MG tablet One tablet 2 to 3 times daily (Patient not taking: Reported on 09/04/2022) 90 tablet 1   sertraline (ZOLOFT) 100 MG tablet TAKE 1 TABLET(100 MG) BY MOUTH AT BEDTIME (Patient not taking: Reported on 09/04/2022) 90 tablet 1   No facility-administered medications prior to visit.    Review of Systems;  Patient denies headache, fevers, malaise, unintentional weight loss, skin rash, eye pain, sinus congestion and sinus pain, sore throat, dysphagia,  hemoptysis , cough, dyspnea, wheezing, chest pain, palpitations, orthopnea, edema, abdominal pain, nausea, melena, diarrhea,  constipation, flank pain, dysuria, hematuria, urinary  Frequency, nocturia, numbness, tingling, seizures,  Focal weakness, Loss of consciousness,  Tremor, insomnia, depression, and suicidal ideation.      Objective:  BP 100/70 (BP Location: Left Arm, Patient Position: Sitting, Cuff Size: Normal)   Pulse 76   Temp 97.7 F (36.5 C) (Oral)   Ht 5\' 5"  (1.651 m)   Wt 124 lb 9.6 oz (56.5 kg)   SpO2 99%   BMI 20.73 kg/m   BP Readings from Last 3 Encounters:  09/04/22 100/70  08/16/21 100/74  03/20/18 102/64    Wt Readings from Last 3 Encounters:  09/04/22 124 lb 9.6 oz (56.5 kg)  05/29/22 123 lb 6.4 oz (56 kg)  08/16/21 160 lb (72.6 kg)    General appearance: alert, cooperative and appears stated age Ears: normal TM's and external ear canals both ears Throat: lips, mucosa, and tongue normal; teeth and gums normal Neck: no adenopathy, no carotid bruit, supple, symmetrical, trachea midline and thyroid not enlarged, symmetric, no tenderness/mass/nodules Back: symmetric, no curvature. ROM normal. No CVA tenderness. Lungs: clear to auscultation bilaterally Heart: regular rate and rhythm, S1, S2 normal, no murmur, click, rub or gallop Abdomen: soft, non-tender; bowel sounds normal; no masses,  no organomegaly Pulses: 2+ and symmetric Skin: Skin color, texture, turgor normal. No rashes or lesions Lymph nodes: Cervical, supraclavicular, and axillary nodes normal. Neuro:  awake and interactive with normal mood and affect. Higher cortical functions are normal. Speech is clear without word-finding difficulty or dysarthria. Extraocular  movements are intact. Visual fields of both eyes are grossly intact. Sensation to light touch is grossly intact bilaterally of upper and lower extremities. Motor examination shows 4+/5 symmetric hand grip and upper extremity and 5/5 lower extremity strength. There is no pronation or drift. Gait is non-ataxic   No results found for: "HGBA1C"  Lab Results   Component Value Date   CREATININE 0.72 05/29/2022   CREATININE 0.82 11/10/2019   CREATININE 0.79 03/20/2018    Lab Results  Component Value Date   WBC 6.5 05/29/2022   HGB 14.7 05/29/2022   HCT 43.9 05/29/2022   PLT 329.0 05/29/2022   GLUCOSE 81 05/29/2022   CHOL 171 05/29/2022   TRIG 56.0 05/29/2022   HDL 69.00 05/29/2022   LDLCALC 91 05/29/2022   ALT 11 05/29/2022   AST 14 05/29/2022   NA 139 05/29/2022   K 4.1 05/29/2022   CL 104 05/29/2022   CREATININE 0.72 05/29/2022   BUN 13 05/29/2022   CO2 26 05/29/2022   TSH 0.82 05/29/2022   INR 1.0 02/14/2017    US Pelvis Complete  Result Date: 02/20/2017 CLINICAL DATA:  Intermittent vaginal bleeding starting 01/27/2017, virginal patient on OCP EXAM: TRANSABDOMINAL ULTRASOUND OF PELVIS TECHNIQUE: Transabdominal ultrasound examination of the pelvis was performed including evaluation of the uterus, ovaries, adnexal regions, and pelvic cul-de-sac. COMPARISON:  None. FINDINGS: Uterus Measurements: 3.4 x 4.9 cm. No fibroids or other mass visualized. Endometrium Thickness: 3 mm thickness within normal limits. No focal abnormality. Right ovary Measurements: 2.8 x 1.6 x 1.8 cm. Normal appearance/no adnexal mass. Left ovary Measurements: 3.2 x 1.1 x 1.8 cm. Normal appearance/no adnexal mass. Other findings:  No abnormal free fluid. IMPRESSION: 1. Normal size uterus. Normal endometrial stripe thickness. Unremarkable ovaries. No adnexal mass. No pelvic free fluid. Electronically Signed   By: Lahoma Crocker M.D.   On: 02/20/2017 16:13    Assessment & Plan:   Problem List Items Addressed This Visit     Panic anxiety syndrome    Starting paroxetine at 5 mg daily with food, advised to advance to full tablet 10 mg when tolerated,  Follow up one month..  Propranolol prn 5 mg   Panic attacks .  She was given a list of counsellors/psychologists and advised to reach out to one,  Or to request a referral to a therapist in [place] is she decides to choose  from a directory of [place] therapists       Relevant Medications   PARoxetine (PAXIL) 10 MG tablet   B12 deficiency    She has t=started taking a vitamin B12 supplement       Iron deficiency anemia    Resolved by July labs.       Other Visit Diagnoses     Need for immunization against influenza    -  Primary   Relevant Orders   Flu Vaccine QUAD 89mo+IM (Fluarix, Fluzone & Alfiuria Quad PF) (Completed)   Need for Tdap vaccination       Relevant Orders   Tdap vaccine greater than or equal to 7yo IM (Completed)       I spent a total of  30 minutes with this patient in a face to face visit on the date of this encounter reviewing the last office visit with me July,   patient's current d exercise habits  and post visit ordering of testing and therapeutics.    Follow-up: No follow-ups on file.   Crecencio Mc, MD

## 2022-09-04 NOTE — Patient Instructions (Signed)
You received the TDaP vaccine and the flu vaccine today  You may experience some body aches  or a low grade fever over the next 48 hours,  which can be treated with tylenol and/or motrin   I am recommending you start the paxil (paroxetine) at 1/2 tablet daily WITH A MEAL FOR THE FIRST WEEK (to prevent nausea)  Increase dose to full tablet (10 mg) when ready.  You can increase again after 2 weeks at 10 mg  Propranolol 1/2 tablet if needed to manage panic attacks (rapid heart rate, etc)   Please schedule a one month virtual follow up to discuss any medication changes if needed

## 2022-09-04 NOTE — Assessment & Plan Note (Addendum)
Starting paroxetine at 5 mg daily with food, advised to advance to full tablet 10 mg when tolerated,  Follow up one month..  Propranolol prn 5 mg   Panic attacks .  She was given a list of counsellors/psychologists and advised to reach out to one,  Or to request a referral to a therapist in [place] is she decides to choose from a directory of [place] therapists

## 2022-09-04 NOTE — Assessment & Plan Note (Signed)
Resolved by July labs.

## 2023-05-08 ENCOUNTER — Ambulatory Visit: Payer: Managed Care, Other (non HMO) | Admitting: Internal Medicine

## 2023-05-08 ENCOUNTER — Encounter: Payer: Self-pay | Admitting: Internal Medicine

## 2023-05-08 VITALS — BP 102/72 | HR 76 | Temp 98.5°F | Ht 65.0 in | Wt 115.6 lb

## 2023-05-08 DIAGNOSIS — F410 Panic disorder [episodic paroxysmal anxiety] without agoraphobia: Secondary | ICD-10-CM

## 2023-05-08 DIAGNOSIS — R197 Diarrhea, unspecified: Secondary | ICD-10-CM | POA: Diagnosis not present

## 2023-05-08 DIAGNOSIS — R1084 Generalized abdominal pain: Secondary | ICD-10-CM

## 2023-05-08 DIAGNOSIS — R634 Abnormal weight loss: Secondary | ICD-10-CM

## 2023-05-08 MED ORDER — PAROXETINE HCL 20 MG PO TABS: 20.0000 mg | ORAL_TABLET | Freq: Every day | ORAL | 1 refills | Status: DC

## 2023-05-08 NOTE — Patient Instructions (Addendum)
I am referring you to  GI  In  [place]  for EGD/colonoscopy  CT abd and pelvis also ordered   In the meantime  we will try the following OTC remedies to slow down the diarrhea :   Return for  labs  .  The stool collection should be done Friday morning and taken to [facility])   Try  taking   Imodium  BEFORE   you eat  Marshmallow   may also help!  TRY TAKING  ONE BIG CUBE  before meals

## 2023-05-08 NOTE — Progress Notes (Signed)
Subjective:  Patient ID: Heidi Goodwin, female    DOB: 1996/02/12  Age: 27 y.o. MRN: [id number]  CC: The primary encounter diagnosis was Diarrhea, unspecified type. Diagnoses of Excessive body weight loss, Generalized abdominal pain, Diarrhea in adult patient, and Panic anxiety syndrome were also pertinent to this visit.   HPI Heidi Goodwin presents for  Chief Complaint  Patient presents with   Medical Management of Chronic Issues   Heidi Goodwin is a 27 yr old female who presents with her mother for evaluation of the following   1) POST PRANDIAL DIARRHEA ACCOMPANIED BY STOMACH CRAMPING AND 10 LB UNINTENTIONAL WEIGHT LOSS. OBER THE PAST 10 MONTHS .    CHANGE IN BM'S  STARTED 3 YEARS AGO.  BUT SHE RECALLS THAT THE FREQUENCY OF STOOLING AND CRAMPING  BECAME WORSE ONE YEAR AGO .  Even occurring after gluten free meals.   No rashes,  no fevers .  Most stools are liquid.  Rare solid stool.  .  Has a loose stool every mornign but not in the middle of the night. Alcohol intake:  1-2 white claws ,  a few times per week.   No blood in stools .Heidi Goodwin  Family history (maternal uncle has ulcerative colitis)  too fatigued to exercise,  scred to rune and have a n attacks  GAD:  increased .  Getting read y to move to [place] with girlfriend .  Recalls having one panic attack.   No insomnia.   Taking paxil 10 mg daily.  Given her impending left transitions. wants to increase dose .   Outpatient Medications Prior to Visit  Medication Sig Dispense Refill   propranolol (INDERAL) 10 MG tablet Take 0.5 tablets (5 mg total) by mouth daily as needed (panic attacks). As needed for rapid heart rate 30 tablet 0   PARoxetine (PAXIL) 10 MG tablet Take 1 tablet (10 mg total) by mouth daily. With food 90 tablet 1   No facility-administered medications prior to visit.    Review of Systems;  Patient denies headache, fevers, malaise,  skin rash, eye pain, sinus congestion and sinus pain, sore throat, dysphagia,  hemoptysis  , cough, dyspnea, wheezing, chest pain, palpitations, orthopnea, edema,  nausea, melena, constipation, flank pain, dysuria, hematuria, urinary  Frequency, nocturia, numbness, tingling, seizures,  Focal weakness, Loss of consciousness,  Tremor, insomnia, depression, anxiety, and suicidal ideation.      Objective:  BP 102/72   Pulse 76   Temp 98.5 F (36.9 C) (Oral)   Ht 5\' 5"  (1.651 m)   Wt 115 lb 9.6 oz (52.4 kg)   SpO2 99%   BMI 19.24 kg/m   BP Readings from Last 3 Encounters:  05/08/23 102/72  09/04/22 100/70  08/16/21 100/74    Wt Readings from Last 3 Encounters:  05/08/23 115 lb 9.6 oz (52.4 kg)  09/04/22 124 lb 9.6 oz (56.5 kg)  05/29/22 123 lb 6.4 oz (56 kg)    Physical Exam Vitals reviewed.  Constitutional:      General: She is not in acute distress.    Appearance: Normal appearance. She is normal weight. She is not ill-appearing, toxic-appearing or diaphoretic.  HENT:     Head: Normocephalic.  Eyes:     General: No scleral icterus.       Right eye: No discharge.        Left eye: No discharge.     Conjunctiva/sclera: Conjunctivae normal.  Cardiovascular:     Rate and Rhythm: Normal rate and  regular rhythm.     Heart sounds: Normal heart sounds.  Pulmonary:     Effort: Pulmonary effort is normal. No respiratory distress.     Breath sounds: Normal breath sounds.  Musculoskeletal:        General: Normal range of motion.  Skin:    General: Skin is warm and dry.  Neurological:     General: No focal deficit present.     Mental Status: She is alert and oriented to person, place, and time. Mental status is at baseline.  Psychiatric:        Mood and Affect: Mood normal.        Behavior: Behavior normal.        Thought Content: Thought content normal.        Judgment: Judgment normal.     No results found for: "HGBA1C"  Lab Results  Component Value Date   CREATININE 0.72 05/29/2022   CREATININE 0.82 11/10/2019   CREATININE 0.79 03/20/2018    Lab  Results  Component Value Date   WBC 6.5 05/29/2022   HGB 14.7 05/29/2022   HCT 43.9 05/29/2022   PLT 329.0 05/29/2022   GLUCOSE 81 05/29/2022   CHOL 171 05/29/2022   TRIG 56.0 05/29/2022   HDL 69.00 05/29/2022   LDLCALC 91 05/29/2022   ALT 11 05/29/2022   AST 14 05/29/2022   NA 139 05/29/2022   K 4.1 05/29/2022   CL 104 05/29/2022   CREATININE 0.72 05/29/2022   BUN 13 05/29/2022   CO2 26 05/29/2022   TSH 0.82 05/29/2022   INR 1.0 02/14/2017    US Pelvis Complete  Result Date: 02/20/2017 CLINICAL DATA:  Intermittent vaginal bleeding starting 01/27/2017, virginal patient on OCP EXAM: TRANSABDOMINAL ULTRASOUND OF PELVIS TECHNIQUE: Transabdominal ultrasound examination of the pelvis was performed including evaluation of the uterus, ovaries, adnexal regions, and pelvic cul-de-sac. COMPARISON:  None. FINDINGS: Uterus Measurements: 3.4 x 4.9 cm. No fibroids or other mass visualized. Endometrium Thickness: 3 mm thickness within normal limits. No focal abnormality. Right ovary Measurements: 2.8 x 1.6 x 1.8 cm. Normal appearance/no adnexal mass. Left ovary Measurements: 3.2 x 1.1 x 1.8 cm. Normal appearance/no adnexal mass. Other findings:  No abnormal free fluid. IMPRESSION: 1. Normal size uterus. Normal endometrial stripe thickness. Unremarkable ovaries. No adnexal mass. No pelvic free fluid. Electronically Signed   By: Natasha Mead M.D.   On: 02/20/2017 16:13    Assessment & Plan:  .Diarrhea, unspecified type -     Celiac Disease Ab Screen w/Rfx; Future -     Sedimentation rate; Future -     Comprehensive metabolic panel; Future -     Magnesium; Future -     GI pathogen panel by PCR, stool; Future -     Ova and parasite examination; Future -     CT ABDOMEN PELVIS W WO CONTRAST; Future  Excessive body weight loss -     CBC with Differential/Platelet; Future -     Iron, TIBC and Ferritin Panel; Future -     B12 and Folate Panel; Future -     TSH; Future -     CT ABDOMEN PELVIS W WO  CONTRAST; Future  Generalized abdominal pain -     CT ABDOMEN PELVIS W WO CONTRAST; Future  Diarrhea in adult patient Assessment & Plan: With uninentional weight loss,  signs of nutritional deficiencies ( hair thinning , pallor). Family h/o of IBD.  CT ab and pelvis ordered ,  Referring for colonoscopy.  Screening labs done including celiac disease, Thyroid function,    Panic anxiety syndrome Assessment & Plan: Increase paxil dose to 20 mg daily    Other orders -     PARoxetine HCl; Take 1 tablet (20 mg total) by mouth daily. With food  Dispense: 90 tablet; Refill: 1    Follow-up: No follow-ups on file.   Sherlene Shams, MD

## 2023-05-10 ENCOUNTER — Other Ambulatory Visit
Admission: RE | Admit: 2023-05-10 | Discharge: 2023-05-10 | Disposition: A | Discharge status: Home / Self Care | Payer: Managed Care, Other (non HMO) | Source: Ambulatory Visit | Attending: Internal Medicine | Admitting: Internal Medicine

## 2023-05-10 DIAGNOSIS — R197 Diarrhea, unspecified: Secondary | ICD-10-CM

## 2023-05-11 DIAGNOSIS — R197 Diarrhea, unspecified: Secondary | ICD-10-CM | POA: Insufficient documentation

## 2023-05-11 NOTE — Assessment & Plan Note (Signed)
With uninentional weight loss,  signs of nutritional deficiencies ( hair thinning , pallor). Family h/o of IBD.  CT ab and pelvis ordered ,  Referring for colonoscopy.  Screening labs done including celiac disease, Thyroid function,

## 2023-05-11 NOTE — Assessment & Plan Note (Signed)
Increase paxil dose to 20 mg daily

## 2023-05-12 LAB — GI PATHOGEN PANEL BY PCR, STOOL

## 2023-05-12 LAB — OVA + PARASITE EXAM

## 2023-05-12 LAB — O&P RESULT

## 2023-05-20 ENCOUNTER — Ambulatory Visit
Admission: RE | Admit: 2023-05-20 | Discharge: 2023-05-20 | Disposition: A | Discharge status: Home / Self Care | Payer: Managed Care, Other (non HMO) | Source: Ambulatory Visit | Attending: Internal Medicine | Admitting: Internal Medicine

## 2023-05-20 DIAGNOSIS — R634 Abnormal weight loss: Secondary | ICD-10-CM | POA: Diagnosis present

## 2023-05-20 DIAGNOSIS — R197 Diarrhea, unspecified: Secondary | ICD-10-CM | POA: Insufficient documentation

## 2023-05-20 DIAGNOSIS — R1084 Generalized abdominal pain: Secondary | ICD-10-CM | POA: Diagnosis present

## 2023-05-20 MED ORDER — IOHEXOL 300 MG/ML  SOLN
75.0000 mL | Freq: Once | INTRAMUSCULAR | Status: AC | PRN
  Administered 2023-05-20: 75 mL via INTRAVENOUS

## 2023-05-20 MED ORDER — BARIUM SULFATE 2 % PO SUSP
450.0000 mL | ORAL | Status: AC
  Administered 2023-05-20 (×2): 450 mL via ORAL

## 2023-05-23 ENCOUNTER — Other Ambulatory Visit (INDEPENDENT_AMBULATORY_CARE_PROVIDER_SITE_OTHER): Payer: Managed Care, Other (non HMO)

## 2023-05-23 DIAGNOSIS — R197 Diarrhea, unspecified: Secondary | ICD-10-CM

## 2023-05-23 DIAGNOSIS — R634 Abnormal weight loss: Secondary | ICD-10-CM | POA: Diagnosis not present

## 2023-05-23 DIAGNOSIS — E538 Deficiency of other specified B group vitamins: Secondary | ICD-10-CM

## 2023-05-23 LAB — CBC WITH DIFFERENTIAL/PLATELET
Basophils Absolute: 0 10*3/uL (ref 0.0–0.1)
Basophils Relative: 0.4 % (ref 0.0–3.0)
Eosinophils Absolute: 0.1 10*3/uL (ref 0.0–0.7)
Eosinophils Relative: 1.5 % (ref 0.0–5.0)
HCT: 42.9 % (ref 36.0–46.0)
Hemoglobin: 14.1 g/dL (ref 12.0–15.0)
Lymphocytes Relative: 28.2 % (ref 12.0–46.0)
Lymphs Abs: 1.4 10*3/uL (ref 0.7–4.0)
MCHC: 32.8 g/dL (ref 30.0–36.0)
MCV: 93.9 fl (ref 78.0–100.0)
Monocytes Absolute: 0.4 10*3/uL (ref 0.1–1.0)
Monocytes Relative: 7.7 % (ref 3.0–12.0)
Neutro Abs: 3.1 10*3/uL (ref 1.4–7.7)
Neutrophils Relative %: 62.2 % (ref 43.0–77.0)
Platelets: 263 10*3/uL (ref 150.0–400.0)
RBC: 4.57 Mil/uL (ref 3.87–5.11)
RDW: 13 % (ref 11.5–15.5)
WBC: 5 10*3/uL (ref 4.0–10.5)

## 2023-05-23 LAB — COMPREHENSIVE METABOLIC PANEL
ALT: 11 U/L (ref 0–35)
AST: 15 U/L (ref 0–37)
Albumin: 4.4 g/dL (ref 3.5–5.2)
Alkaline Phosphatase: 43 U/L (ref 39–117)
BUN: 7 mg/dL (ref 6–23)
CO2: 29 mEq/L (ref 19–32)
Calcium: 9.3 mg/dL (ref 8.4–10.5)
Chloride: 102 mEq/L (ref 96–112)
Creatinine, Ser: 0.76 mg/dL (ref 0.40–1.20)
GFR: 109.44 mL/min (ref >60.00)
Glucose, Bld: 88 mg/dL (ref 70–99)
Potassium: 3.9 mEq/L (ref 3.5–5.1)
Sodium: 138 mEq/L (ref 135–145)
Total Bilirubin: 0.6 mg/dL (ref 0.2–1.2)
Total Protein: 6.6 g/dL (ref 6.0–8.3)

## 2023-05-23 LAB — B12 AND FOLATE PANEL
Folate: 7.9 ng/mL (ref >5.9)
Vitamin B-12: 202 pg/mL — ABNORMAL LOW (ref 211–911)

## 2023-05-23 LAB — MAGNESIUM: Magnesium: 2.1 mg/dL (ref 1.5–2.5)

## 2023-05-23 LAB — TSH: TSH: 1.55 u[IU]/mL (ref 0.35–5.50)

## 2023-05-23 LAB — SEDIMENTATION RATE: Sed Rate: 1 mm/hr (ref 0–20)

## 2023-05-24 LAB — IRON,TIBC AND FERRITIN PANEL
%SAT: 23 % (calc) (ref 16–45)
Ferritin: 9 ng/mL — ABNORMAL LOW (ref 16–154)
Iron: 95 ug/dL (ref 40–190)
TIBC: 419 mcg/dL (calc) (ref 250–450)

## 2023-05-24 MED ORDER — IRON (FERROUS SULFATE) 325 (65 FE) MG PO TABS: 325.0000 mg | ORAL_TABLET | Freq: Every day | ORAL | 2 refills | Status: DC

## 2023-05-24 NOTE — Assessment & Plan Note (Signed)
RECURRENTLY LOW DESPITE IF AB NEGATIVE DURING LAST TESTING.  RESUME INJECTIONS

## 2023-05-24 NOTE — Addendum Note (Signed)
Addended by: Sherlene Shams on: 05/24/2023 05:49 PM   Modules accepted: Orders

## 2023-05-26 LAB — CELIAC DISEASE AB SCREEN W/RFX
Antigliadin Abs, IgA: 5 units (ref 0–19)
IgA/Immunoglobulin A, Serum: 162 mg/dL (ref 87–352)
Transglutaminase IgA: <2 U/mL (ref 0–3)

## 2023-05-27 ENCOUNTER — Encounter: Payer: Self-pay | Admitting: Internal Medicine

## 2023-05-31 ENCOUNTER — Telehealth: Payer: Self-pay

## 2023-05-31 MED ORDER — CYANOCOBALAMIN 1000 MCG/ML IJ SOLN: INTRAMUSCULAR | 0 refills | Status: DC

## 2023-05-31 MED ORDER — "SYRINGE 25G X 1"" 3 ML MISC": 0 refills | Status: DC

## 2023-05-31 NOTE — Telephone Encounter (Signed)
B12 has been sent to pharmacy per pt request. Pt stated that she will be having someone give her there injections because she is moving.

## 2023-10-21 ENCOUNTER — Telehealth: Payer: Self-pay | Admitting: Internal Medicine

## 2023-10-21 NOTE — Telephone Encounter (Signed)
Patient called and would like all her medication transferred to [facility], [place] ,[place] on [address]

## 2023-10-22 MED ORDER — PROPRANOLOL HCL 10 MG PO TABS: 5.0000 mg | ORAL_TABLET | Freq: Every day | ORAL | 0 refills | Status: AC | PRN

## 2023-10-22 MED ORDER — "SYRINGE 25G X 1"" 3 ML MISC": 0 refills | Status: AC

## 2023-10-22 MED ORDER — CYANOCOBALAMIN 1000 MCG/ML IJ SOLN: INTRAMUSCULAR | 0 refills | Status: AC

## 2023-10-22 MED ORDER — IRON (FERROUS SULFATE) 325 (65 FE) MG PO TABS: 325.0000 mg | ORAL_TABLET | Freq: Every day | ORAL | 0 refills | Status: AC

## 2023-10-22 MED ORDER — PAROXETINE HCL 20 MG PO TABS: 20.0000 mg | ORAL_TABLET | Freq: Every day | ORAL | 1 refills | Status: AC

## 2023-10-22 NOTE — Telephone Encounter (Signed)
All prescriptions have been sent to new pharmacy

## 2023-10-22 NOTE — Addendum Note (Signed)
Addended by: Sandy Salaam on: 10/22/2023 03:44 PM   Modules accepted: Orders
